# Patient Record
Sex: Male | Born: 1945 | Race: White | Hispanic: No | Marital: Married | Smoking: Former smoker
Health system: Southern US, Community
[De-identification: ages and names within clinical notes are randomized; demographics above are authoritative.]

## PROBLEM LIST (undated history)

## (undated) DIAGNOSIS — E785 Hyperlipidemia, unspecified: Secondary | ICD-10-CM

## (undated) DIAGNOSIS — K219 Gastro-esophageal reflux disease without esophagitis: Secondary | ICD-10-CM

## (undated) DIAGNOSIS — S060X0A Concussion without loss of consciousness, initial encounter: Secondary | ICD-10-CM

## (undated) DIAGNOSIS — I82409 Acute embolism and thrombosis of unspecified deep veins of unspecified lower extremity: Secondary | ICD-10-CM

## (undated) DIAGNOSIS — R55 Syncope and collapse: Secondary | ICD-10-CM

## (undated) DIAGNOSIS — S0003XA Contusion of scalp, initial encounter: Secondary | ICD-10-CM

## (undated) DIAGNOSIS — Z8739 Personal history of other diseases of the musculoskeletal system and connective tissue: Secondary | ICD-10-CM

## (undated) DIAGNOSIS — D369 Benign neoplasm, unspecified site: Secondary | ICD-10-CM

## (undated) DIAGNOSIS — K2270 Barrett's esophagus without dysplasia: Secondary | ICD-10-CM

## (undated) DIAGNOSIS — I2510 Atherosclerotic heart disease of native coronary artery without angina pectoris: Secondary | ICD-10-CM

## (undated) DIAGNOSIS — C801 Malignant (primary) neoplasm, unspecified: Secondary | ICD-10-CM

## (undated) DIAGNOSIS — N529 Male erectile dysfunction, unspecified: Secondary | ICD-10-CM

## (undated) DIAGNOSIS — N400 Enlarged prostate without lower urinary tract symptoms: Secondary | ICD-10-CM

## (undated) DIAGNOSIS — J45909 Unspecified asthma, uncomplicated: Secondary | ICD-10-CM

## (undated) DIAGNOSIS — C6290 Malignant neoplasm of unspecified testis, unspecified whether descended or undescended: Secondary | ICD-10-CM

## (undated) DIAGNOSIS — K449 Diaphragmatic hernia without obstruction or gangrene: Secondary | ICD-10-CM

## (undated) DIAGNOSIS — J189 Pneumonia, unspecified organism: Secondary | ICD-10-CM

## (undated) HISTORY — DX: Male erectile dysfunction, unspecified: N52.9

## (undated) HISTORY — DX: Benign prostatic hyperplasia without lower urinary tract symptoms: N40.0

## (undated) HISTORY — DX: Concussion without loss of consciousness, initial encounter: S06.0X0A

## (undated) HISTORY — DX: Gastro-esophageal reflux disease without esophagitis: K21.9

## (undated) HISTORY — DX: Barrett's esophagus without dysplasia: K22.70

## (undated) HISTORY — PX: TONSILLECTOMY: SUR1361

## (undated) HISTORY — DX: Benign neoplasm, unspecified site: D36.9

## (undated) HISTORY — PX: WISDOM TOOTH EXTRACTION: SHX21

## (undated) HISTORY — PX: CHOLECYSTECTOMY: SHX55

## (undated) HISTORY — PX: CATARACT EXTRACTION, BILATERAL: SHX1313

## (undated) HISTORY — DX: Malignant neoplasm of unspecified testis, unspecified whether descended or undescended: C62.90

## (undated) HISTORY — DX: Malignant (primary) neoplasm, unspecified: C80.1

## (undated) HISTORY — DX: Diaphragmatic hernia without obstruction or gangrene: K44.9

---

## 1995-03-09 DIAGNOSIS — C6290 Malignant neoplasm of unspecified testis, unspecified whether descended or undescended: Secondary | ICD-10-CM

## 1995-03-09 HISTORY — DX: Malignant neoplasm of unspecified testis, unspecified whether descended or undescended: C62.90

## 1995-03-09 HISTORY — PX: TESTICLE REMOVAL: SHX68

## 2007-04-19 ENCOUNTER — Ambulatory Visit: Payer: Self-pay | Admitting: Internal Medicine

## 2007-04-19 DIAGNOSIS — Z8547 Personal history of malignant neoplasm of testis: Secondary | ICD-10-CM | POA: Insufficient documentation

## 2007-04-20 ENCOUNTER — Ambulatory Visit: Payer: Self-pay | Admitting: Internal Medicine

## 2007-04-20 DIAGNOSIS — Z8571 Personal history of Hodgkin lymphoma: Secondary | ICD-10-CM | POA: Insufficient documentation

## 2007-04-23 ENCOUNTER — Encounter: Payer: Self-pay | Admitting: Internal Medicine

## 2007-04-26 LAB — CONVERTED CEMR LAB
AST: 29 units/L (ref 0–37)
Bilirubin, Direct: 0.1 mg/dL (ref 0.0–0.3)
CO2: 30 meq/L (ref 19–32)
Chloride: 107 meq/L (ref 96–112)
Creatinine, Ser: 1.1 mg/dL (ref 0.4–1.5)
Eosinophils Relative: 10.8 % — ABNORMAL HIGH (ref 0.0–5.0)
Glucose, Bld: 105 mg/dL — ABNORMAL HIGH (ref 70–99)
HCT: 41.1 % (ref 39.0–52.0)
Hemoglobin: 14 g/dL (ref 13.0–17.0)
LDL Cholesterol: 122 mg/dL — ABNORMAL HIGH (ref 0–99)
Neutrophils Relative %: 47.1 % (ref 43.0–77.0)
RBC: 4.47 M/uL (ref 4.22–5.81)
RDW: 11.5 % (ref 11.5–14.6)
Sodium: 140 meq/L (ref 135–145)
Total Bilirubin: 0.7 mg/dL (ref 0.3–1.2)
Total CHOL/HDL Ratio: 3.1
Total Protein: 6.2 g/dL (ref 6.0–8.3)
WBC: 4.4 10*3/uL — ABNORMAL LOW (ref 4.5–10.5)
hCG, Beta Chain, Quant, S: 0.93 milliintl units/mL

## 2007-04-27 ENCOUNTER — Encounter: Payer: Self-pay | Admitting: Internal Medicine

## 2007-04-28 ENCOUNTER — Encounter: Payer: Self-pay | Admitting: Internal Medicine

## 2008-03-07 ENCOUNTER — Ambulatory Visit: Payer: Self-pay | Admitting: Internal Medicine

## 2008-03-07 DIAGNOSIS — R12 Heartburn: Secondary | ICD-10-CM | POA: Insufficient documentation

## 2009-01-09 ENCOUNTER — Ambulatory Visit: Payer: Self-pay | Admitting: Diagnostic Radiology

## 2009-01-09 ENCOUNTER — Ambulatory Visit (HOSPITAL_BASED_OUTPATIENT_CLINIC_OR_DEPARTMENT_OTHER): Admission: RE | Admit: 2009-01-09 | Discharge: 2009-01-09 | Payer: Self-pay | Admitting: Internal Medicine

## 2009-01-09 ENCOUNTER — Ambulatory Visit: Payer: Self-pay | Admitting: Internal Medicine

## 2009-01-09 DIAGNOSIS — R059 Cough, unspecified: Secondary | ICD-10-CM | POA: Insufficient documentation

## 2009-01-09 DIAGNOSIS — R05 Cough: Secondary | ICD-10-CM

## 2009-01-28 ENCOUNTER — Telehealth: Payer: Self-pay | Admitting: Internal Medicine

## 2010-04-13 ENCOUNTER — Encounter: Payer: Self-pay | Admitting: Internal Medicine

## 2010-04-29 NOTE — Letter (Signed)
Summary: [facility]  [facility]   Imported By: Maryln Gottron 04/24/2010 10:38:45  _____________________________________________________________________  External Attachment:    Type:   Image     Comment:   External Document

## 2010-05-14 NOTE — Letter (Signed)
Summary: [facility]  [facility]   Imported By: Maryln Gottron 05/06/2010 15:49:03  _____________________________________________________________________  External Attachment:    Type:   Image     Comment:   External Document

## 2011-08-25 DIAGNOSIS — N398 Other specified disorders of urinary system: Secondary | ICD-10-CM | POA: Insufficient documentation

## 2012-09-11 DIAGNOSIS — N400 Enlarged prostate without lower urinary tract symptoms: Secondary | ICD-10-CM | POA: Insufficient documentation

## 2012-09-11 DIAGNOSIS — N529 Male erectile dysfunction, unspecified: Secondary | ICD-10-CM | POA: Insufficient documentation

## 2013-02-23 ENCOUNTER — Ambulatory Visit (INDEPENDENT_AMBULATORY_CARE_PROVIDER_SITE_OTHER): Payer: No Typology Code available for payment source

## 2013-02-23 ENCOUNTER — Ambulatory Visit (INDEPENDENT_AMBULATORY_CARE_PROVIDER_SITE_OTHER): Payer: No Typology Code available for payment source | Admitting: Podiatrist

## 2013-02-23 ENCOUNTER — Encounter: Payer: Self-pay | Admitting: Podiatrist

## 2013-02-23 VITALS — BP 131/84 | HR 72 | Resp 12

## 2013-02-23 DIAGNOSIS — M109 Gout, unspecified: Secondary | ICD-10-CM

## 2013-02-23 DIAGNOSIS — L02619 Cutaneous abscess of unspecified foot: Secondary | ICD-10-CM

## 2013-02-23 DIAGNOSIS — R52 Pain, unspecified: Secondary | ICD-10-CM

## 2013-02-23 MED ORDER — CLINDAMYCIN HCL 300 MG PO CAPS: 300.0000 mg | ORAL_CAPSULE | Freq: Three times a day (TID) | ORAL | Status: DC

## 2013-02-23 MED ORDER — COLCHICINE 0.6 MG PO TABS: 0.6000 mg | ORAL_TABLET | Freq: Every day | ORAL | Status: DC

## 2013-02-23 NOTE — Progress Notes (Signed)
   Subjective:    Patient ID: Dwayne Harper, male    DOB: [birth year], 67 y.o.   MRN: [id number]  HPI Comments: '' THE LT ANKLE IS SWOLLEN AND SORE.''  Foot Pain This is a new problem. The current episode started 1 to 4 weeks ago. The problem occurs constantly. Associated symptoms include myalgias. The symptoms are aggravated by walking and standing. He has tried ice and NSAIDs for the symptoms. The treatment provided mild relief.      Review of Systems  Cardiovascular: Positive for leg swelling.  Musculoskeletal: Positive for myalgias.  Skin: Positive for color change.  All other systems reviewed and are negative.       Objective:   Physical Exam Neurovascular status is intact with palpable pedal pulses and neurological sensation intact. Slight redness along the medial aspect of the left ankle is present. Slight swelling is also noted. X-rays are unremarkable for any sign of fracture or dislocation.       Assessment & Plan:  Gout left vs cellulitus Plan: Injected along the sinus tarsi region with Kenalog and Marcaine mixture. Started him on colcrys Also started him on an antibiotic. I will see him back in one week for followup and will order arthritic panel at that time to discern if gout could be a possibility

## 2013-02-23 NOTE — Patient Instructions (Addendum)
You prescriptions have been called into [facility]-- [address]  Gout Gout is an inflammatory arthritis caused by a buildup of uric acid crystals in the joints. Uric acid is a chemical that is normally present in the blood. When the level of uric acid in the blood is too high it can form crystals that deposit in your joints and tissues. This causes joint redness, soreness, and swelling (inflammation). Repeat attacks are common. Over time, uric acid crystals can form into masses (tophi) near a joint, destroying bone and causing disfigurement. Gout is treatable and often preventable. CAUSES  The disease begins with elevated levels of uric acid in the blood. Uric acid is produced by your body when it breaks down a naturally found substance called purines. Certain foods you eat, such as meats and fish, contain high amounts of purines. Causes of an elevated uric acid level include:  Being passed down from parent to child (heredity).  Diseases that cause increased uric acid production (such as obesity, psoriasis, and certain cancers).  Excessive alcohol use.  Diet, especially diets rich in meat and seafood.  Medicines, including certain cancer-fighting medicines (chemotherapy), water pills (diuretics), and aspirin.  Chronic kidney disease. The kidneys are no longer able to remove uric acid well.  Problems with metabolism. Conditions strongly associated with gout include:  Obesity.  High blood pressure.  High cholesterol.  Diabetes. Not everyone with elevated uric acid levels gets gout. It is not understood why some people get gout and others do not. Surgery, joint injury, and eating too much of certain foods are some of the factors that can lead to gout attacks. SYMPTOMS   An attack of gout comes on quickly. It causes intense pain with redness, swelling, and warmth in a joint.  Fever can occur.  Often, only one joint is involved. Certain joints are more commonly  involved:  Base of the big toe.  Knee.  Ankle.  Wrist.  Finger. Without treatment, an attack usually goes away in a few days to weeks. Between attacks, you usually will not have symptoms, which is different from many other forms of arthritis. DIAGNOSIS  Your caregiver will suspect gout based on your symptoms and exam. In some cases, tests may be recommended. The tests may include:  Blood tests.  Urine tests.  X-rays.  Joint fluid exam. This exam requires a needle to remove fluid from the joint (arthrocentesis). Using a microscope, gout is confirmed when uric acid crystals are seen in the joint fluid. TREATMENT  There are two phases to gout treatment: treating the sudden onset (acute) attack and preventing attacks (prophylaxis).  Treatment of an Acute Attack.  Medicines are used. These include anti-inflammatory medicines or steroid medicines.  An injection of steroid medicine into the affected joint is sometimes necessary.  The painful joint is rested. Movement can worsen the arthritis.  You may use warm or cold treatments on painful joints, depending which works best for you.  Treatment to Prevent Attacks.  If you suffer from frequent gout attacks, your caregiver may advise preventive medicine. These medicines are started after the acute attack subsides. These medicines either help your kidneys eliminate uric acid from your body or decrease your uric acid production. You may need to stay on these medicines for a very long time.  The early phase of treatment with preventive medicine can be associated with an increase in acute gout attacks. For this reason, during the first few months of treatment, your caregiver may also  advise you to take medicines usually used for acute gout treatment. Be sure you understand your caregiver's directions. Your caregiver may make several adjustments to your medicine dose before these medicines are effective.  Discuss dietary treatment with your  caregiver or dietitian. Alcohol and drinks high in sugar and fructose and foods such as meat, poultry, and seafood can increase uric acid levels. Your caregiver or dietician can advise you on drinks and foods that should be limited. HOME CARE INSTRUCTIONS   Do not take aspirin to relieve pain. This raises uric acid levels.  Only take over-the-counter or prescription medicines for pain, discomfort, or fever as directed by your caregiver.  Rest the joint as much as possible. When in bed, keep sheets and blankets off painful areas.  Keep the affected joint raised (elevated).  Apply warm or cold treatments to painful joints. Use of warm or cold treatments depends on which works best for you.  Use crutches if the painful joint is in your leg.  Drink enough fluids to keep your urine clear or pale yellow. This helps your body get rid of uric acid. Limit alcohol, sugary drinks, and fructose drinks.  Follow your dietary instructions. Pay careful attention to the amount of protein you eat. Your daily diet should emphasize fruits, vegetables, whole grains, and fat-free or low-fat milk products. Discuss the use of coffee, vitamin C, and cherries with your caregiver or dietician. These may be helpful in lowering uric acid levels.  Maintain a healthy body weight. SEEK MEDICAL CARE IF:   You develop diarrhea, vomiting, or any side effects from medicines.  You do not feel better in 24 hours, or you are getting worse. SEEK IMMEDIATE MEDICAL CARE IF:   Your joint becomes suddenly more tender, and you have chills or a fever. MAKE SURE YOU:   Understand these instructions.  Will watch your condition.  Will get help right away if you are not doing well or get worse. Document Released: 02/20/2000 Document Revised: 06/19/2012 Document Reviewed: 10/06/2011 [facility] Patient Information 2014 [place], [place].   Cellulitis Cellulitis is an infection of the skin and the tissue beneath it. The infected  area is usually red and tender. Cellulitis occurs most often in the arms and lower legs.  CAUSES  Cellulitis is caused by bacteria that enter the skin through cracks or cuts in the skin. The most common types of bacteria that cause cellulitis are Staphylococcus and Streptococcus. SYMPTOMS   Redness and warmth.  Swelling.  Tenderness or pain.  Fever. DIAGNOSIS  Your caregiver can usually determine what is wrong based on a physical exam. Blood tests may also be done. TREATMENT  Treatment usually involves taking an antibiotic medicine. HOME CARE INSTRUCTIONS   Take your antibiotics as directed. Finish them even if you start to feel better.  Keep the infected arm or leg elevated to reduce swelling.  Apply a warm cloth to the affected area up to 4 times per day to relieve pain.  Only take over-the-counter or prescription medicines for pain, discomfort, or fever as directed by your caregiver.  Keep all follow-up appointments as directed by your caregiver. SEEK MEDICAL CARE IF:   You notice red streaks coming from the infected area.  Your red area gets larger or turns dark in color.  Your bone or joint underneath the infected area becomes painful after the skin has healed.  Your infection returns in the same area or another area.  You notice a swollen bump in the infected area.  You develop new symptoms. SEEK IMMEDIATE MEDICAL CARE IF:   You have a fever.  You feel very sleepy.  You develop vomiting or diarrhea.  You have a general ill feeling (malaise) with muscle aches and pains. MAKE SURE YOU:   Understand these instructions.  Will watch your condition.  Will get help right away if you are not doing well or get worse. Document Released: 12/02/2004 Document Revised: 08/24/2011 Document Reviewed: 05/10/2011 [facility] Patient Information 2014 [place], [place].

## 2013-02-28 ENCOUNTER — Ambulatory Visit: Payer: Self-pay | Admitting: Podiatry

## 2013-03-16 ENCOUNTER — Ambulatory Visit: Payer: No Typology Code available for payment source | Admitting: Podiatrist

## 2013-10-06 HISTORY — PX: COLONOSCOPY: SHX174

## 2015-09-06 DIAGNOSIS — I82409 Acute embolism and thrombosis of unspecified deep veins of unspecified lower extremity: Secondary | ICD-10-CM

## 2015-09-06 HISTORY — DX: Acute embolism and thrombosis of unspecified deep veins of unspecified lower extremity: I82.409

## 2015-09-26 ENCOUNTER — Observation Stay (HOSPITAL_COMMUNITY)
Admission: EM | Admit: 2015-09-26 | Discharge: 2015-09-29 | Disposition: A | Discharge status: Home / Self Care | Payer: PRIVATE HEALTH INSURANCE | Attending: Internal Medicine | Admitting: Internal Medicine

## 2015-09-26 ENCOUNTER — Encounter (HOSPITAL_COMMUNITY): Payer: Self-pay | Admitting: *Deleted

## 2015-09-26 ENCOUNTER — Emergency Department (HOSPITAL_COMMUNITY): Payer: PRIVATE HEALTH INSURANCE

## 2015-09-26 ENCOUNTER — Observation Stay (HOSPITAL_COMMUNITY): Payer: PRIVATE HEALTH INSURANCE

## 2015-09-26 DIAGNOSIS — G9340 Encephalopathy, unspecified: Secondary | ICD-10-CM | POA: Diagnosis present

## 2015-09-26 DIAGNOSIS — D696 Thrombocytopenia, unspecified: Secondary | ICD-10-CM | POA: Diagnosis not present

## 2015-09-26 DIAGNOSIS — W19XXXA Unspecified fall, initial encounter: Secondary | ICD-10-CM | POA: Diagnosis not present

## 2015-09-26 DIAGNOSIS — S0003XA Contusion of scalp, initial encounter: Secondary | ICD-10-CM | POA: Diagnosis present

## 2015-09-26 DIAGNOSIS — Z8249 Family history of ischemic heart disease and other diseases of the circulatory system: Secondary | ICD-10-CM | POA: Diagnosis not present

## 2015-09-26 DIAGNOSIS — R413 Other amnesia: Secondary | ICD-10-CM | POA: Insufficient documentation

## 2015-09-26 DIAGNOSIS — S060X0A Concussion without loss of consciousness, initial encounter: Secondary | ICD-10-CM | POA: Insufficient documentation

## 2015-09-26 DIAGNOSIS — Z7982 Long term (current) use of aspirin: Secondary | ICD-10-CM | POA: Insufficient documentation

## 2015-09-26 DIAGNOSIS — Z8571 Personal history of Hodgkin lymphoma: Secondary | ICD-10-CM | POA: Diagnosis not present

## 2015-09-26 DIAGNOSIS — R55 Syncope and collapse: Secondary | ICD-10-CM | POA: Diagnosis not present

## 2015-09-26 DIAGNOSIS — Z8547 Personal history of malignant neoplasm of testis: Secondary | ICD-10-CM | POA: Insufficient documentation

## 2015-09-26 HISTORY — DX: Contusion of scalp, initial encounter: S00.03XA

## 2015-09-26 HISTORY — DX: Syncope and collapse: R55

## 2015-09-26 HISTORY — DX: Thrombocytopenia, unspecified: D69.6

## 2015-09-26 LAB — CBC WITH DIFFERENTIAL/PLATELET
BASOS ABS: 0 10*3/uL (ref 0.0–0.1)
BASOS PCT: 0 %
Eosinophils Absolute: 0.2 10*3/uL (ref 0.0–0.7)
Eosinophils Relative: 2 %
HEMATOCRIT: 45.5 % (ref 39.0–52.0)
HEMOGLOBIN: 15.6 g/dL (ref 13.0–17.0)
Lymphocytes Relative: 15 %
Lymphs Abs: 1.5 10*3/uL (ref 0.7–4.0)
MCH: 31.2 pg (ref 26.0–34.0)
MCHC: 34.3 g/dL (ref 30.0–36.0)
MCV: 91 fL (ref 78.0–100.0)
Monocytes Absolute: 0.8 10*3/uL (ref 0.1–1.0)
Monocytes Relative: 8 %
NEUTROS ABS: 7.4 10*3/uL (ref 1.7–7.7)
NEUTROS PCT: 75 %
Platelets: 131 10*3/uL — ABNORMAL LOW (ref 150–400)
RBC: 5 MIL/uL (ref 4.22–5.81)
RDW: 12.2 % (ref 11.5–15.5)
WBC: 9.7 10*3/uL (ref 4.0–10.5)

## 2015-09-26 LAB — URINALYSIS W MICROSCOPIC (NOT AT ARMC)
BACTERIA UA: NONE SEEN
BILIRUBIN URINE: NEGATIVE
Glucose, UA: NEGATIVE mg/dL
HGB URINE DIPSTICK: NEGATIVE
KETONES UR: 15 mg/dL — AB
Leukocytes, UA: NEGATIVE
NITRITE: NEGATIVE
PROTEIN: NEGATIVE mg/dL
RBC / HPF: NONE SEEN RBC/hpf (ref 0–5)
Specific Gravity, Urine: 1.017 (ref 1.005–1.030)
pH: 6.5 (ref 5.0–8.0)

## 2015-09-26 LAB — COMPREHENSIVE METABOLIC PANEL
ALK PHOS: 50 U/L (ref 38–126)
ALT: 27 U/L (ref 17–63)
ANION GAP: 7 (ref 5–15)
AST: 32 U/L (ref 15–41)
Albumin: 3.9 g/dL (ref 3.5–5.0)
BILIRUBIN TOTAL: 0.6 mg/dL (ref 0.3–1.2)
BUN: 18 mg/dL (ref 6–20)
CALCIUM: 9.2 mg/dL (ref 8.9–10.3)
CO2: 26 mmol/L (ref 22–32)
Chloride: 105 mmol/L (ref 101–111)
Creatinine, Ser: 0.95 mg/dL (ref 0.61–1.24)
Glucose, Bld: 111 mg/dL — ABNORMAL HIGH (ref 65–99)
Potassium: 4.1 mmol/L (ref 3.5–5.1)
SODIUM: 138 mmol/L (ref 135–145)
TOTAL PROTEIN: 7.1 g/dL (ref 6.5–8.1)

## 2015-09-26 LAB — RAPID URINE DRUG SCREEN, HOSP PERFORMED
Amphetamines: NOT DETECTED
Barbiturates: NOT DETECTED
Benzodiazepines: NOT DETECTED
Cocaine: NOT DETECTED
OPIATES: NOT DETECTED
Tetrahydrocannabinol: NOT DETECTED

## 2015-09-26 MED ORDER — PANTOPRAZOLE SODIUM 40 MG PO TBEC
40.0000 mg | DELAYED_RELEASE_TABLET | Freq: Every day | ORAL | Status: DC
  Administered 2015-09-26 – 2015-09-29 (×4): 40 mg via ORAL
  Filled 2015-09-26 (×4): qty 1

## 2015-09-26 MED ORDER — ONDANSETRON HCL 4 MG PO TABS: 4.0000 mg | ORAL_TABLET | Freq: Four times a day (QID) | ORAL | Status: DC | PRN

## 2015-09-26 MED ORDER — LIDOCAINE-EPINEPHRINE (PF) 2 %-1:200000 IJ SOLN
20.0000 mL | Freq: Once | INTRAMUSCULAR | Status: AC
  Administered 2015-09-26: 20 mL
  Filled 2015-09-26: qty 20

## 2015-09-26 MED ORDER — GLUCOSAMINE-CHONDROITIN 500-400 MG PO TABS: 1.0000 | ORAL_TABLET | Freq: Every day | ORAL | Status: DC

## 2015-09-26 MED ORDER — SODIUM CHLORIDE 0.9% FLUSH
3.0000 mL | Freq: Two times a day (BID) | INTRAVENOUS | Status: DC
  Administered 2015-09-26 – 2015-09-29 (×5): 3 mL via INTRAVENOUS

## 2015-09-26 MED ORDER — BISACODYL 10 MG RE SUPP: 10.0000 mg | Freq: Every day | RECTAL | Status: DC | PRN

## 2015-09-26 MED ORDER — ALUM & MAG HYDROXIDE-SIMETH 200-200-20 MG/5ML PO SUSP
30.0000 mL | Freq: Four times a day (QID) | ORAL | Status: DC | PRN
  Administered 2015-09-26 – 2015-09-28 (×3): 30 mL via ORAL
  Filled 2015-09-26 (×3): qty 30

## 2015-09-26 MED ORDER — CO Q 10 10 MG PO CAPS: ORAL_CAPSULE | Freq: Every day | ORAL | Status: DC

## 2015-09-26 MED ORDER — ASPIRIN EC 81 MG PO TBEC
81.0000 mg | DELAYED_RELEASE_TABLET | Freq: Every day | ORAL | Status: DC
  Administered 2015-09-26 – 2015-09-28 (×3): 81 mg via ORAL
  Filled 2015-09-26 (×3): qty 1

## 2015-09-26 MED ORDER — ACETAMINOPHEN 650 MG RE SUPP: 650.0000 mg | Freq: Four times a day (QID) | RECTAL | Status: DC | PRN

## 2015-09-26 MED ORDER — TETANUS-DIPHTH-ACELL PERTUSSIS 5-2.5-18.5 LF-MCG/0.5 IM SUSP
0.5000 mL | Freq: Once | INTRAMUSCULAR | Status: AC
  Administered 2015-09-26: 0.5 mL via INTRAMUSCULAR
  Filled 2015-09-26: qty 0.5

## 2015-09-26 MED ORDER — ONDANSETRON HCL 4 MG/2ML IJ SOLN: 4.0000 mg | Freq: Four times a day (QID) | INTRAMUSCULAR | Status: DC | PRN

## 2015-09-26 MED ORDER — HYDROCODONE-ACETAMINOPHEN 5-325 MG PO TABS: 1.0000 | ORAL_TABLET | ORAL | Status: DC | PRN

## 2015-09-26 MED ORDER — ACETAMINOPHEN 325 MG PO TABS
650.0000 mg | ORAL_TABLET | Freq: Four times a day (QID) | ORAL | Status: DC | PRN
  Administered 2015-09-26 – 2015-09-28 (×4): 650 mg via ORAL
  Filled 2015-09-26 (×4): qty 2

## 2015-09-26 MED ORDER — SODIUM CHLORIDE 0.9 % IV SOLN
INTRAVENOUS | Status: AC
  Administered 2015-09-26: 1000 mL via INTRAVENOUS

## 2015-09-26 NOTE — ED Notes (Signed)
Head wound cleansed with H2O2 and NS.  The largest wound appears to be longer than originally charted; it is approx 5-6 cm.

## 2015-09-26 NOTE — ED Notes (Signed)
MD at bedside, Hospitalist.

## 2015-09-26 NOTE — Progress Notes (Signed)
New pt admission from ED. Pt brought to the floor in stable condition. Vitals taken. Initial Assessment done. All immediate pertinent needs to patient addressed. Patient Guide given to patient. Important safety instructions relating to hospitalization reviewed with patient. Patient verbalized understanding. Pt has head lacerations to L side of head with staples present. Will continue to monitor pt.  Maurene Capes RN

## 2015-09-26 NOTE — ED Notes (Signed)
Patient transported to CT 

## 2015-09-26 NOTE — ED Notes (Signed)
ECHOCARDIOGRAM ACCIDENTALLY CLICKED AS COMPLETE. PT STILL NEEDS AN ECHOCARDIOGRAM.

## 2015-09-26 NOTE — ED Notes (Addendum)
Three lacerations noted to posterior left scalp, bleeding controlled with hematoma present.  One approx 1 cm, second is approx 2cm, third laceration is approx 3 cm.  Hair cleansed of dried blood as well as possible.  Continues to have memory issues.

## 2015-09-26 NOTE — ED Provider Notes (Signed)
CSN: [id number]     Arrival date & time 09/26/15  1053 History   First MD Initiated Contact with Patient 09/26/15 1116     Chief Complaint  Patient presents with  . Fall  . Head Injury     (Consider location/radiation/quality/duration/timing/severity/associated sxs/prior Treatment) HPI   Patient is a 70 year old male with a history of testicular cancer who presents to the ED after a fall that occurred roughly 30 minutes PTA. Patient was packing the car to go on vacation, he was in his garage. He is unsure if it was a syncopal episode or if he slipped. He called for his wife. She found him on the garage floor, she states he was confused. They're unsure if he lost consciousness. Wife states that in route to the ED he could not remember where they were going, he thought it was Saturday, and he was unsure of their anniversary date. Patient currently is complaining of lightheadedness and a foggy feeling. He has no other complaints. Wife states he has increased stress at his job and he has had little sleep in the past week. Patient denies headache, visual changes, nausea, vomiting, abdominal pain, weakness, numbness, chest pain, shortness of breath, back pain.  Of note: The patient is wife returned from a trip to [place] roughly 2 weeks ago. Patient denies calf pain or swelling or shortness of breath.  Past Medical History  Diagnosis Date  . Cancer ([place])   . Syncope   . Gout   . Scalp hematoma    History reviewed. No pertinent past surgical history. Family History  Problem Relation Age of Onset  . CAD Mother   . Heart attack Mother   . Coronary artery disease Father   . Aneurysm Father    Social History  Substance Use Topics  . Smoking status: Never Smoker   . Smokeless tobacco: None  . Alcohol Use: No    Review of Systems  Constitutional: Negative for fever and chills.  HENT: Negative for trouble swallowing and voice change.   Eyes: Negative for photophobia and visual disturbance.   Respiratory: Negative for cough and shortness of breath.   Cardiovascular: Negative for chest pain and leg swelling.  Gastrointestinal: Negative for nausea, vomiting, abdominal pain, diarrhea and constipation.  Musculoskeletal: Negative for back pain, neck pain and neck stiffness.  Skin: Positive for wound.  Neurological: Positive for light-headedness. Negative for dizziness, facial asymmetry, speech difficulty, weakness, numbness and headaches.  Psychiatric/Behavioral: Positive for confusion. Negative for agitation.      Allergies  Lactose intolerance (gi); Peanut-containing drug products; and Penicillins  Home Medications   Prior to Admission medications   Medication Sig Start Date End Date Taking? Authorizing Provider  Ascorbic Acid (VITAMIN C PO) Take 1 tablet by mouth daily.   Yes Historical Provider, MD  Coenzyme Q10 (CO Q 10 PO) Take 1 tablet by mouth daily.   Yes Historical Provider, MD  Cyanocobalamin (VITAMIN B-12 PO) Take 1 tablet by mouth daily.   Yes Historical Provider, MD  glucosamine-chondroitin 500-400 MG tablet Take 1 tablet by mouth daily.    Yes Historical Provider, MD  Multiple Vitamin (MULTIVITAMIN) tablet Take 1 tablet by mouth daily.   Yes Historical Provider, MD  Omega-3 Fatty Acids (FISH OIL) 1000 MG CAPS Take 1 capsule by mouth daily.    Yes Historical Provider, MD  omeprazole (PRILOSEC) 20 MG capsule Take 20 mg by mouth daily.   Yes Historical Provider, MD  aspirin 81 MG tablet Take 81 mg by  mouth daily.    Historical Provider, MD  clindamycin (CLEOCIN) 300 MG capsule Take 1 capsule (300 mg total) by mouth 3 (three) times daily. Patient not taking: Reported on 09/26/2015 02/23/13   Bronson Ing, DPM  colchicine 0.6 MG tablet Take 1 tablet (0.6 mg total) by mouth daily. Patient not taking: Reported on 09/26/2015 02/23/13   Bronson Ing, DPM   BP 123/73 mmHg  Pulse 69  Temp(Src) 98 F (36.7 C) (Oral)  Resp 14  SpO2 99% Physical Exam    Physical Exam  Constitutional: Pt is oriented to person, place, and time. Pt appears well-developed and well-nourished. No distress.  HENT:  Head: Normocephalic multiple lacerations noted to back of head, one measuring roughly 5cm, one roughly 1.5 and the other roughly 2cm  Mouth/Throat: Oropharynx is clear and moist.  Eyes: Conjunctivae and EOM are normal. Pupils are equal, round, and reactive to light. No scleral icterus.  No horizontal, vertical or rotational nystagmus  Neck: Normal range of motion. Neck supple.  Full active and passive ROM without pain No midline or paraspinal tenderness No nuchal rigidity or meningeal signs  Cardiovascular: Normal rate, regular rhythm no murmur rubs or gallops, 2+ DP and PT pulses.   Pulmonary/Chest: Effort normal and breath sounds normal. No respiratory distress. Pt has no wheezes. No rales.  Abdominal: Soft. Bowel sounds are normal. There is no tenderness. There is no rebound and no guarding, no CVA tenderness.  Musculoskeletal: Normal range of motion, no edema noted.  Neurological: Pt. is alert and oriented to person, place, and time. No cranial nerve deficit.  Exhibits normal muscle tone. Coordination normal.  Mental Status:  Alert, oriented, thought content appropriate. Speech fluent without evidence of aphasia. Able to follow 2 step commands without difficulty.  Cranial Nerves:  II:  Peripheral visual fields grossly normal, pupils equal, round, reactive to light III,IV, VI: ptosis not present, extra-ocular motions intact bilaterally  V,VII: smile symmetric, facial light touch sensation equal VIII: hearing grossly normal bilaterally  IX,X: midline uvula rise  XI: bilateral shoulder shrug equal and strong XII: midline tongue extension  Motor:  5/5 in upper and lower extremities bilaterally including strong and equal grip strength and dorsiflexion/plantar flexion Sensory: Light touch normal in all extremities.  Cerebellar: normal  finger-to-nose with bilateral upper extremities, no pronator drift  Gait: normal gait  CV: distal pulses palpable throughout   Skin: Skin is warm and dry. No rash noted. Pt is not diaphoretic.  Psychiatric: Pt has a normal mood and affect. Behavior is normal. Judgment and thought content normal.  Nursing note and vitals reviewed.    ED Course  .Marland KitchenLaceration Repair Date/Time: 09/26/2015 3:35 PM Performed by: Claris Gower,  L Authorized by: Jackson Latino L Consent: Verbal consent obtained. Risks and benefits: risks, benefits and alternatives were discussed Consent given by: patient Patient understanding: patient states understanding of the procedure being performed Required items: required blood products, implants, devices, and special equipment available Patient identity confirmed: arm band Time out: Immediately prior to procedure a "time out" was called to verify the correct patient, procedure, equipment, support staff and site/side marked as required. Body area: head/neck Location details: scalp Laceration length: 7 cm Foreign bodies: no foreign bodies Anesthesia: local infiltration Local anesthetic: lidocaine 2% with epinephrine Anesthetic total: 9 ml Patient sedated: no Preparation: Patient was prepped and draped in the usual sterile fashion. Irrigation solution: saline Irrigation method: syringe Amount of cleaning: standard Debridement: none Skin closure: staples Number of sutures: 14 Approximation: close  Approximation difficulty: simple Dressing: 4x4 sterile gauze Patient tolerance: Patient tolerated the procedure well with no immediate complications   (including critical care time) Labs Review Labs Reviewed  COMPREHENSIVE METABOLIC PANEL - Abnormal; Notable for the following:    Glucose, Bld 111 (*)    All other components within normal limits  CBC WITH DIFFERENTIAL/PLATELET - Abnormal; Notable for the following:    Platelets 131 (*)    All other components  within normal limits  URINALYSIS W MICROSCOPIC (NOT AT [facility])  URINE RAPID DRUG SCREEN, HOSP PERFORMED    Imaging Review Ct Head Wo Contrast  09/26/2015  CLINICAL DATA:  Pain following fall.  Transient memory loss of event EXAM: CT HEAD WITHOUT CONTRAST TECHNIQUE: Contiguous axial images were obtained from the base of the skull through the vertex without intravenous contrast. COMPARISON:  None. FINDINGS: Brain: There is age related volume loss. There is no intracranial mass, hemorrhage, extra-axial fluid collection, or midline shift. The gray-white compartments appear normal. No acute infarct evident. Vascular: There are foci of vascular calcification in left carotid siphon and cavernous carotid artery regions on the left. There are no hyperdense vessels. Skull: The bony calvarium appears intact. There is a left parietal scalp hematoma. Sinuses/Orbits: Visualized orbits appear symmetric bilaterally. Visualized paranasal sinuses are clear. Other: Visualized mastoid air cells are clear. IMPRESSION: Age related volume loss. No intracranial mass, hemorrhage, or extra-axial fluid collection. Gray-white compartments appear normal. Mild calcification in left carotid siphon and cavernous carotid artery regions. There is a left parietal scalp hematoma. Electronically Signed   By: Lowella Grip III M.D.   On: 09/26/2015 13:22   I have personally reviewed and evaluated these images and lab results as part of my medical decision-making.   EKG Interpretation   Date/Time:  Friday September 26 2015 11:25:54 EDT Ventricular Rate:  70 PR Interval:    QRS Duration: 84 QT Interval:  384 QTC Calculation: 415 R Axis:   51 Text Interpretation:  Normal sinus rhythm Normal ECG No old tracing to  compare Confirmed by GOLDSTON MD, SCOTT [phone]) on 09/26/2015 11:28:44 AM      MDM   Final diagnoses:  Scalp hematoma, initial encounter   Patient with syncopal episode. Unsure of etiology. CT scan revealed no acute  abnormalities. EKG with normal sinus rhythm. Labs unremarkable except for thrombocytopenia. Patient's confusion has improved since arrival to the ED. He is aware of this confusion. We feel at this time it is best to admit the patient for observation for further workup. I will consult the hospitalist team for admission.  Laceration repaired in the ED with staples. Patient given fluids while in the ED.  I spoke with Dyanne Carrel, NP who will admit the patient for further evaluation and treatment. Thank you to Dyanne Carrel and Dr. Eulas Post for your care of the patient.  Patient case discussed and patient seen by Dr. Regenia Skeeter who agrees with the above plan.   Kalman Drape, PA 09/26/15 [place], MD 10/02/15 [phone]

## 2015-09-26 NOTE — ED Notes (Signed)
Returned from CT--No change in status. 

## 2015-09-26 NOTE — ED Notes (Addendum)
Pt and family reports that pt fell and hit back of head on cement floor. Unknown loc. Denies being on blood thinners. Has laceration to back of head.

## 2015-09-26 NOTE — H&P (Signed)
History and Physical    Dwayne Harper [id number] DOB: 10-10-1945 DOA: 09/26/2015  PCP: Drema Pry, DO Dwayne Harper Patient coming from: home  Chief Complaint: syncope and collapse  HPI: Dwayne Harper is a very pleasant 70 y.o. male with medical history significant for gout remote testicular cancer since to the emergency department with the chief complaint of fall/collapse and subsequent memory loss. Initial evaluation concerning for concussion related to head injury secondary to likely syncopal event.  Information is obtained from the patient and the wife is at the bedside. Of note information from patient may be unreliable as he is demonstrating short-term memory deficit. He reports last memory packing the car in the garage in preparation for trip to the beach. His next memory is sitting on the floor of the garage with his wife telling him he needed go to the hospital. She reports she heard him calling her from the garage and she found him on the floor lying in a "pool of blood" and he was somewhat confused. It is unclear if he actually lost consciousness. Wife states she helped him up he had a steady gait as he ambulated to the car but during the ride he was disoriented to time had forgotten where they were going and was unsure of their anniversary date. He denies any chest pain palpitations headache dizziness visual disturbances. He denies any numbness or tingling of extremities. He denies shortness of breath cough fever chills lower extremity edema. He denies abdominal pain nausea vomiting diarrhea. He denies dysuria hematuria frequency or urgency. He states he's been eating and drinking his normal amount and denies any unintentional weight loss. Wife reports increased stress at his job and decreased sleep. In addition they have been home for 3 weeks several weeks prior to that they were traveling extensively in [place].    ED Course: In the emergency department he is afebrile hemodynamically  stable and not hypoxic.  Review of Systems: As per HPI otherwise 10 point review of systems negative.   Ambulatory Status: He ambulates independently with a steady gait no recent falls  Past Medical History  Diagnosis Date  . Cancer ([place])   . Syncope   . Gout   . Scalp hematoma     History reviewed. No pertinent past surgical history.  Social History  Lives at home with his wife. 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 Marital Status: Married    Spouse Name: N/A  . Number of Children: N/A  . Years of Education: N/A   Occupational History  . Not on file.   Social History Main Topics  . Smoking status: Never Smoker   . Smokeless tobacco: Not on file  . Alcohol Use: No  . Drug Use: No  . Sexual Activity: Not on file   Other Topics Concern  . Not on file   Social History Narrative    Allergies  Allergen Reactions  . Lactose Intolerance (Gi)   . Peanut-Containing Drug Products Nausea And Vomiting  . Penicillins     Childhood ; Mother has a severe reaction     Family History  Problem Relation Age of Onset  . CAD Mother   . Heart attack Mother   . Coronary artery disease Father   . Aneurysm Father     Prior to Admission medications   Medication Sig Start Date End Date Taking? Authorizing Provider  Ascorbic Acid (VITAMIN C PO) Take 1 tablet by mouth  daily.   Yes Historical Provider, MD  Coenzyme Q10 (CO Q 10 PO) Take 1 tablet by mouth daily.   Yes Historical Provider, MD  Cyanocobalamin (VITAMIN B-12 PO) Take 1 tablet by mouth daily.   Yes Historical Provider, MD  glucosamine-chondroitin 500-400 MG tablet Take 1 tablet by mouth daily.    Yes Historical Provider, MD  Multiple Vitamin (MULTIVITAMIN) tablet Take 1 tablet by mouth daily.   Yes Historical Provider, MD  Omega-3 Fatty Acids (FISH OIL) 1000 MG CAPS Take 1 capsule by mouth daily.    Yes Historical Provider, MD  omeprazole (PRILOSEC) 20 MG capsule Take 20 mg by  mouth daily.   Yes Historical Provider, MD  aspirin 81 MG tablet Take 81 mg by mouth daily.    Historical Provider, MD  clindamycin (CLEOCIN) 300 MG capsule Take 1 capsule (300 mg total) by mouth 3 (three) times daily. Patient not taking: Reported on 09/26/2015 02/23/13   Bronson Ing, DPM  colchicine 0.6 MG tablet Take 1 tablet (0.6 mg total) by mouth daily. Patient not taking: Reported on 09/26/2015 02/23/13   Bronson Ing, DPM    Physical Exam: Filed Vitals:   09/26/15 1215 09/26/15 1230 09/26/15 1245 09/26/15 1330  BP: 123/83 138/88 118/87 130/78  Pulse: 77 72 75 71  Temp:      TempSrc:      Resp: 12 20 14 13   SpO2: 97% 98% 99% 98%     General:  Appears Slightly anxious but not uncomfortable Eyes:  PERRL, EOMI, normal lids, iris ENT:  grossly normal hearing, lips & tongue, mucous membranes of his mouth are pink slightly dry Neck:  no LAD, masses or thyromegaly Cardiovascular:  RRR, no m/r/g. No LE edema. Pedal pulses present and palpable Respiratory:  CTA bilaterally, no w/r/r. Normal respiratory effort. Abdomen:  soft, ntnd, positive bowel sounds throughout no guarding or rebounding Skin:  no rash or induration seen on limited exam, respiration left Musculoskeletal:  grossly normal tone BUE/BLE, good ROM, no bony abnormality Psychiatric:  grossly normal mood and affect, speech fluent and appropriate, AOx3 Neurologic:  CN 2-12 grossly intact, moves all extremities in coordinated fashion, sensation intact. Short-term memory deficit speech clear facial symmetry follows commands oriented 3  Labs on Admission: I have personally reviewed following labs and imaging studies  CBC:  Recent Labs Lab 09/26/15 1230  WBC 9.7  NEUTROABS 7.4  HGB 15.6  HCT 45.5  MCV 91.0  PLT [id number]*   Basic Metabolic Panel:  Recent Labs Lab 09/26/15 1230  NA 138  K 4.1  CL 105  CO2 26  GLUCOSE 111*  BUN 18  CREATININE 0.95  CALCIUM 9.2   GFR: CrCl cannot be calculated (Unknown  ideal weight.). Liver Function Tests:  Recent Labs Lab 09/26/15 1230  AST 32  ALT 27  ALKPHOS 50  BILITOT 0.6  PROT 7.1  ALBUMIN 3.9   No results for input(s): LIPASE, AMYLASE in the last 168 hours. No results for input(s): AMMONIA in the last 168 hours. Coagulation Profile: No results for input(s): INR, PROTIME in the last 168 hours. Cardiac Enzymes: No results for input(s): CKTOTAL, CKMB, CKMBINDEX, TROPONINI in the last 168 hours. BNP (last 3 results) No results for input(s): PROBNP in the last 8760 hours. HbA1C: No results for input(s): HGBA1C in the last 72 hours. CBG: No results for input(s): GLUCAP in the last 168 hours. Lipid Profile: No results for input(s): CHOL, HDL, LDLCALC, TRIG, CHOLHDL, LDLDIRECT in the last 72 hours.  Thyroid Function Tests: No results for input(s): TSH, T4TOTAL, FREET4, T3FREE, THYROIDAB in the last 72 hours. Anemia Panel: No results for input(s): VITAMINB12, FOLATE, FERRITIN, TIBC, IRON, RETICCTPCT in the last 72 hours. Urine analysis: No results found for: COLORURINE, APPEARANCEUR, LABSPEC, PHURINE, GLUCOSEU, HGBUR, BILIRUBINUR, KETONESUR, PROTEINUR, UROBILINOGEN, NITRITE, LEUKOCYTESUR  Creatinine Clearance: CrCl cannot be calculated (Unknown ideal weight.).  Sepsis Labs: @LABRCNTIP (procalcitonin:4,lacticidven:4) )No results found for this or any previous visit (from the past 240 hour(s)).   Radiological Exams on Admission: Ct Head Wo Contrast  09/26/2015  CLINICAL DATA:  Pain following fall.  Transient memory loss of event EXAM: CT HEAD WITHOUT CONTRAST TECHNIQUE: Contiguous axial images were obtained from the base of the skull through the vertex without intravenous contrast. COMPARISON:  None. FINDINGS: Brain: There is age related volume loss. There is no intracranial mass, hemorrhage, extra-axial fluid collection, or midline shift. The gray-white compartments appear normal. No acute infarct evident. Vascular: There are foci of vascular  calcification in left carotid siphon and cavernous carotid artery regions on the left. There are no hyperdense vessels. Skull: The bony calvarium appears intact. There is a left parietal scalp hematoma. Sinuses/Orbits: Visualized orbits appear symmetric bilaterally. Visualized paranasal sinuses are clear. Other: Visualized mastoid air cells are clear. IMPRESSION: Age related volume loss. No intracranial mass, hemorrhage, or extra-axial fluid collection. Gray-white compartments appear normal. Mild calcification in left carotid siphon and cavernous carotid artery regions. There is a left parietal scalp hematoma. Electronically Signed   By: Lowella Grip III M.D.   On: 09/26/2015 13:22    EKG: Independently reviewed. Normal sinus rhythm Normal ECG No old tracing to compare  Assessment/Plan Principal Problem:   Syncope and collapse Active Problems:   History of Hodgkin's disease   Scalp hematoma   Acute encephalopathy   Thrombocytopenia (HCC)   #1. Syncope and collapse. Etiology unclear. CT of the head age-related volume loss no intracranial mass hemorrhage, Initial troponin negative. EKG without acute abnormality. No metabolic derangement. No leukocytosis patient's afebrile and hemodynamically stable and nontoxic appearing. No medication changes -Admit to telemetry -cycle troponin -Serial EKG -2-D echo -EEG -Urinalysis -Urine drug screen -Chest x-ray -orthostatic vital signs  2. Acute encephalopathy likely related to head wound related to #1. CT of the head as noted above. Patient demonstrated mild confusion with memory deficits. Improving on admission. MD spoke to neuro who recommended EEG. No focal defecits -will hold off on MRI as improving on admission -neuro checks -obtain folate B12.rpr  #3.scalp hematoma -sutured by ed staff -monitor  4. Thrombocytopenia. Mild. -Outpatient follow-up     DVT prophylaxis: scd Code Status: fu;;  Family Communication: wife at bedside    Disposition Plan: home  Consults called: none  Admission status: obs    Dyanne Carrel M MD [facility]  If 7PM-7AM, please contact night-coverage [email or web address] Password [facility]  09/26/2015, 4:34 PM

## 2015-09-27 ENCOUNTER — Observation Stay (HOSPITAL_COMMUNITY): Payer: PRIVATE HEALTH INSURANCE

## 2015-09-27 ENCOUNTER — Other Ambulatory Visit: Payer: Self-pay | Admitting: Cardiology

## 2015-09-27 ENCOUNTER — Observation Stay (HOSPITAL_BASED_OUTPATIENT_CLINIC_OR_DEPARTMENT_OTHER): Payer: PRIVATE HEALTH INSURANCE

## 2015-09-27 DIAGNOSIS — R55 Syncope and collapse: Secondary | ICD-10-CM

## 2015-09-27 DIAGNOSIS — I82432 Acute embolism and thrombosis of left popliteal vein: Secondary | ICD-10-CM | POA: Diagnosis not present

## 2015-09-27 HISTORY — PX: TRANSTHORACIC ECHOCARDIOGRAM: SHX275

## 2015-09-27 LAB — ECHOCARDIOGRAM COMPLETE
HEIGHTINCHES: 68.5 in
Weight: 2942.4 oz

## 2015-09-27 LAB — CK TOTAL AND CKMB (NOT AT ARMC)
CK TOTAL: 92 U/L (ref 49–397)
CK, MB: 2.2 ng/mL (ref 0.5–5.0)
Relative Index: INVALID (ref 0.0–2.5)

## 2015-09-27 MED ORDER — HEPARIN SODIUM (PORCINE) 5000 UNIT/ML IJ SOLN
5000.0000 [IU] | Freq: Three times a day (TID) | INTRAMUSCULAR | Status: DC
  Filled 2015-09-27: qty 1

## 2015-09-27 MED ORDER — SODIUM CHLORIDE 0.9 % IV SOLN
INTRAVENOUS | Status: DC
  Administered 2015-09-27 – 2015-09-29 (×3): via INTRAVENOUS

## 2015-09-27 NOTE — Progress Notes (Signed)
PROGRESS NOTE                                                                                                                                                                                                             Patient Demographics:    Dwayne Harper, is a 70 y.o. male, DOB - January 17, 1946, [id number]  Admit date - 09/26/2015   Admitting Physician Lily Kocher, MD  Outpatient Primary MD for the patient is Drema Pry, DO  LOS -    Chief Complaint  Patient presents with  . Fall  . Head Injury       Brief Narrative  70 year old male with history of testicular cancer, who presents with syncope, admitted for further workup.   Subjective:    Dwayne Harper today has, No headache, No chest pain, No abdominal pain - No Nausea, No new weakness tingling or numbness, No Cough - SOB.   Assessment  & Plan :    Principal Problem:   Syncope and collapse Active Problems:   History of Hodgkin's disease   Scalp hematoma   Acute encephalopathy   Thrombocytopenia (HCC)   Concussion with no loss of consciousness  Syncope - Unclear etiology, EKG without acute abnormality, no significant lab abnormalities, 2-D echo with normal EF, no valvular disease, no regional wall motion abnormality, not orthostatic, EEG done, reading pending, will check bilateral carotid Dopplers, will check MRI brain without contrast, continue on telemetry monitor, discussed with cardiology PA to arrange for 30 days Holter monitor as an outpatient, continue with IV fluids.   Acute encephalopathy - This is most likely related to concussion, mentation back to baseline, will check MRI brain  Scalp hematoma - Sutured by ED staff       Code Status : Full  Family Communication  : wife at bedside  Disposition Plan  : Home when stable  Consults  :  None  Procedures  : None  DVT Prophylaxis  :  Heparin   Lab Results  Component Value Date   PLT 131* 09/26/2015     Antibiotics  :    Anti-infectives    None        Objective:   Filed Vitals:   09/26/15 2015 09/27/15 0044 09/27/15 0507 09/27/15 1209  BP: 133/79 116/70 98/85 126/77  Pulse: 91 84 85 76  Temp: 97.4  F (36.3 C) 97.6 F (36.4 C) 97.5 F (36.4 C) 98.5 F (36.9 C)  TempSrc: Oral Oral Oral Oral  Resp: 16 16 16 20   Height:      Weight:   83.416 kg (183 lb 14.4 oz)   SpO2: 95% 98% 98% 91%    Wt Readings from Last 3 Encounters:  09/27/15 83.416 kg (183 lb 14.4 oz)  01/09/09 89.245 kg (196 lb 12 oz)  03/07/08 86.81 kg (191 lb 6.1 oz)     Intake/Output Summary (Last 24 hours) at 09/27/15 1422 Last data filed at 09/27/15 1323  Gross per 24 hour  Intake   1200 ml  Output    900 ml  Net    300 ml     Physical Exam  Awake Alert, Oriented X 3,  [name].AT,PERRAL Supple Neck,No JVD, No cervical lymphadenopathy appriciated.  Symmetrical Chest wall movement, Good air movement bilaterally, CTAB RRR,No Gallops,Rubs or new Murmurs, No Parasternal Heave +ve B.Sounds, Abd Soft, No tenderness, No organomegaly appriciated, No rebound - guarding or rigidity. No Cyanosis, Clubbing or edema, No new Rash or bruise      Data Review:    CBC  Recent Labs Lab 09/26/15 1230  WBC 9.7  HGB 15.6  HCT 45.5  PLT 131*  MCV 91.0  MCH 31.2  MCHC 34.3  RDW 12.2  LYMPHSABS 1.5  MONOABS 0.8  EOSABS 0.2  BASOSABS 0.0    Chemistries   Recent Labs Lab 09/26/15 1230  NA 138  K 4.1  CL 105  CO2 26  GLUCOSE 111*  BUN 18  CREATININE 0.95  CALCIUM 9.2  AST 32  ALT 27  ALKPHOS 50  BILITOT 0.6   ------------------------------------------------------------------------------------------------------------------ No results for input(s): CHOL, HDL, LDLCALC, TRIG, CHOLHDL, LDLDIRECT in the last 72 hours.  No results found for: HGBA1C ------------------------------------------------------------------------------------------------------------------ No results for input(s): TSH,  T4TOTAL, T3FREE, THYROIDAB in the last 72 hours.  Invalid input(s): FREET3 ------------------------------------------------------------------------------------------------------------------ No results for input(s): VITAMINB12, FOLATE, FERRITIN, TIBC, IRON, RETICCTPCT in the last 72 hours.  Coagulation profile No results for input(s): INR, PROTIME in the last 168 hours.  No results for input(s): DDIMER in the last 72 hours.  Cardiac Enzymes  Recent Labs Lab 09/27/15 1103  CKMB 2.2   ------------------------------------------------------------------------------------------------------------------ No results found for: BNP  Inpatient Medications  Scheduled Meds: . aspirin EC  81 mg Oral Daily  . pantoprazole  40 mg Oral Daily  . sodium chloride flush  3 mL Intravenous Q12H   Continuous Infusions: . sodium chloride 75 mL/hr at 09/27/15 1112   PRN Meds:.acetaminophen **OR** acetaminophen, alum & mag hydroxide-simeth, bisacodyl, HYDROcodone-acetaminophen, ondansetron **OR** ondansetron (ZOFRAN) IV  Micro Results No results found for this or any previous visit (from the past 240 hour(s)).  Radiology Reports Ct Head Wo Contrast  09/26/2015  CLINICAL DATA:  Pain following fall.  Transient memory loss of event EXAM: CT HEAD WITHOUT CONTRAST TECHNIQUE: Contiguous axial images were obtained from the base of the skull through the vertex without intravenous contrast. COMPARISON:  None. FINDINGS: Brain: There is age related volume loss. There is no intracranial mass, hemorrhage, extra-axial fluid collection, or midline shift. The gray-white compartments appear normal. No acute infarct evident. Vascular: There are foci of vascular calcification in left carotid siphon and cavernous carotid artery regions on the left. There are no hyperdense vessels. Skull: The bony calvarium appears intact. There is a left parietal scalp hematoma. Sinuses/Orbits: Visualized orbits appear symmetric bilaterally.  Visualized paranasal sinuses are clear. Other: Visualized  mastoid air cells are clear. IMPRESSION: Age related volume loss. No intracranial mass, hemorrhage, or extra-axial fluid collection. Gray-white compartments appear normal. Mild calcification in left carotid siphon and cavernous carotid artery regions. There is a left parietal scalp hematoma. Electronically Signed   By: Lowella Grip III M.D.   On: 09/26/2015 13:22   Dg Chest Port 1 View  09/26/2015  CLINICAL DATA:  Syncope, collapse EXAM: PORTABLE CHEST 1 VIEW COMPARISON:  09/21/2011 FINDINGS: Cardiomediastinal silhouette is stable. No acute infiltrate or pleural effusion. No pulmonary edema. Atherosclerotic calcifications of thoracic aorta. IMPRESSION: No active disease. Electronically Signed   By: Lahoma Crocker M.D.   On: 09/26/2015 17:16     ,  M.D on 09/27/2015 at 2:22 PM  Between 7am to 7pm - Pager - [phone]  After 7pm go to [email or web address] - password [facility]  [facility] -  Office  [phone]

## 2015-09-27 NOTE — Progress Notes (Signed)
VASCULAR LAB PRELIMINARY  PRELIMINARY  PRELIMINARY  PRELIMINARY  Carotid duplex completed.    Preliminary report:  1-39% ICA plaquing.  Vertebral artery flow is antegrade.   , , RVT 09/27/2015, 4:24 PM

## 2015-09-27 NOTE — Progress Notes (Signed)
EEG completed, results pending. 

## 2015-09-27 NOTE — Progress Notes (Signed)
Echocardiogram 2D Echocardiogram has been performed.  Aggie Cosier 09/27/2015, 8:23 AM

## 2015-09-27 NOTE — Procedures (Signed)
EEG Report  Clinical history:  Concussion with altered mental status.  Technical Summary:  A 19  Channel digital EEG recording was performed using the 10-20 international system of electrode placement.  Bipolar and referential montages were used.  The total recording time was approx 20 minutes.  Findings:  There is a posterior dominant rhythm of 9 Hz reactive to eye opening and closure.  Hyperventilation and photic stimulation were not performed.  Sleep was not recorded.  Throughout the recording, there was no focal slowing, epileptiform discharges, or electrographic seizures present.    Impression:  This is a normal EEG in the awake state.  There is no evidence of a seizure tendency on this recording, however, it does not rule out underlying epilepsy.  If warranted, a sleep deprived EEG and/or a more prolonged EEG may be of additional value.   Rogue Jury, MS, MD

## 2015-09-28 ENCOUNTER — Encounter (HOSPITAL_COMMUNITY): Payer: Self-pay | Admitting: *Deleted

## 2015-09-28 ENCOUNTER — Observation Stay (HOSPITAL_COMMUNITY): Payer: PRIVATE HEALTH INSURANCE

## 2015-09-28 ENCOUNTER — Observation Stay (HOSPITAL_BASED_OUTPATIENT_CLINIC_OR_DEPARTMENT_OTHER): Payer: PRIVATE HEALTH INSURANCE

## 2015-09-28 DIAGNOSIS — R7989 Other specified abnormal findings of blood chemistry: Secondary | ICD-10-CM

## 2015-09-28 DIAGNOSIS — I82432 Acute embolism and thrombosis of left popliteal vein: Secondary | ICD-10-CM | POA: Diagnosis not present

## 2015-09-28 DIAGNOSIS — R55 Syncope and collapse: Secondary | ICD-10-CM

## 2015-09-28 DIAGNOSIS — I82442 Acute embolism and thrombosis of left tibial vein: Secondary | ICD-10-CM

## 2015-09-28 LAB — BASIC METABOLIC PANEL
Anion gap: 3 — ABNORMAL LOW (ref 5–15)
BUN: 14 mg/dL (ref 6–20)
CHLORIDE: 110 mmol/L (ref 101–111)
CO2: 25 mmol/L (ref 22–32)
Calcium: 8.1 mg/dL — ABNORMAL LOW (ref 8.9–10.3)
Creatinine, Ser: 0.9 mg/dL (ref 0.61–1.24)
GFR calc Af Amer: >60 mL/min (ref >60)
GFR calc non Af Amer: >60 mL/min (ref >60)
GLUCOSE: 103 mg/dL — AB (ref 65–99)
POTASSIUM: 4.6 mmol/L (ref 3.5–5.1)
Sodium: 138 mmol/L (ref 135–145)

## 2015-09-28 LAB — VAS US CAROTID
LCCADDIAS: -20 cm/s
LCCAPSYS: 129 cm/s
LEFT ECA DIAS: -13 cm/s
LEFT VERTEBRAL DIAS: -16 cm/s
LICADSYS: -70 cm/s
Left CCA dist sys: -95 cm/s
Left CCA prox dias: 25 cm/s
Left ICA dist dias: -28 cm/s
Left ICA prox dias: -29 cm/s
Left ICA prox sys: -82 cm/s
RCCADSYS: -76 cm/s
RCCAPDIAS: -13 cm/s
RIGHT ECA DIAS: -11 cm/s
RIGHT VERTEBRAL DIAS: 9 cm/s
Right CCA prox sys: -116 cm/s

## 2015-09-28 LAB — HEPARIN LEVEL (UNFRACTIONATED): Heparin Unfractionated: 1.18 IU/mL — ABNORMAL HIGH (ref 0.30–0.70)

## 2015-09-28 LAB — CBC
HEMATOCRIT: 39.9 % (ref 39.0–52.0)
HEMOGLOBIN: 13.4 g/dL (ref 13.0–17.0)
MCH: 30.7 pg (ref 26.0–34.0)
MCHC: 33.6 g/dL (ref 30.0–36.0)
MCV: 91.5 fL (ref 78.0–100.0)
PLATELETS: 122 10*3/uL — AB (ref 150–400)
RBC: 4.36 MIL/uL (ref 4.22–5.81)
RDW: 12.3 % (ref 11.5–15.5)
WBC: 6.9 10*3/uL (ref 4.0–10.5)

## 2015-09-28 LAB — D-DIMER, QUANTITATIVE (NOT AT ARMC): D DIMER QUANT: 7.07 ug{FEU}/mL — AB (ref 0.00–0.50)

## 2015-09-28 MED ORDER — HEPARIN (PORCINE) IN NACL 100-0.45 UNIT/ML-% IJ SOLN
900.0000 [IU]/h | INTRAMUSCULAR | Status: AC
  Administered 2015-09-29: 1050 [IU]/h via INTRAVENOUS
  Filled 2015-09-28: qty 250

## 2015-09-28 MED ORDER — HEPARIN (PORCINE) IN NACL 100-0.45 UNIT/ML-% IJ SOLN
1250.0000 [IU]/h | INTRAMUSCULAR | Status: DC
  Administered 2015-09-28: 1250 [IU]/h via INTRAVENOUS
  Filled 2015-09-28: qty 250

## 2015-09-28 MED ORDER — IOPAMIDOL (ISOVUE-370) INJECTION 76%
INTRAVENOUS | Status: AC
  Administered 2015-09-28: 100 mL
  Filled 2015-09-28: qty 100

## 2015-09-28 MED ORDER — HEPARIN BOLUS VIA INFUSION
4000.0000 [IU] | Freq: Once | INTRAVENOUS | Status: AC
  Administered 2015-09-28: 4000 [IU] via INTRAVENOUS
  Filled 2015-09-28: qty 4000

## 2015-09-28 NOTE — Progress Notes (Signed)
ANTICOAGULATION CONSULT NOTE - Initial Consult  Pharmacy Consult for Heparin Indication: DVT, rule out PE  Allergies  Allergen Reactions  . Lactose Intolerance (Gi)   . Peanut-Containing Drug Products Nausea And Vomiting  . Penicillins     Childhood ; Mother has a severe reaction     Patient Measurements: Height: 5' 8.5" (174 cm) Weight: 183 lb 14.4 oz (83.4 kg) IBW/kg (Calculated) : 69.55  Vital Signs: Temp: 97.7 F (36.5 C) (07/23 1225) Temp Source: Oral (07/23 1225) BP: 142/76 (07/23 1225) Pulse Rate: 71 (07/23 1225)  Labs:  Recent Labs  09/26/15 1230 09/27/15 1103 09/28/15 0226  HGB 15.6  --  13.4  HCT 45.5  --  39.9  PLT 131*  --  122*  CREATININE 0.95  --  0.90  CKTOTAL  --  92  --   CKMB  --  2.2  --     Estimated Creatinine Clearance: 78.4 mL/min (by C-G formula based on SCr of 0.9 mg/dL).   Medical History: Past Medical History:  Diagnosis Date  . Cancer ([place])   . Gout   . Scalp hematoma   . Syncope     Assessment: 70 year old male to begin heparin for new DVT, rule out PE  Goal of Therapy:  Heparin level 0.3-0.7 units/ml Monitor platelets by anticoagulation protocol: Yes   Plan:  Heparin 4000 units iv bolus x 1 Heparin drip at 1250 units / hr Heparin level 6 hours after heparin starts Daily heparin level, CBC  Thank you Anette Guarneri, PharmD [phone]  09/28/2015,1:00 PM

## 2015-09-28 NOTE — Progress Notes (Signed)
ANTICOAGULATION CONSULT NOTE - Initial Consult  Pharmacy Consult for Heparin Indication: DVT, rule out PE  Allergies  Allergen Reactions  . Lactose Intolerance (Gi)   . Peanut-Containing Drug Products Nausea And Vomiting  . Penicillins     Childhood ; Mother has a severe reaction     Patient Measurements: Height: 5' 8.5" (174 cm) Weight: 183 lb 14.4 oz (83.4 kg) IBW/kg (Calculated) : 69.55  Vital Signs: Temp: 97.6 F (36.4 C) (07/23 2050) Temp Source: Oral (07/23 2050) BP: 132/75 (07/23 2050) Pulse Rate: 82 (07/23 2050)  Labs:  Recent Labs  09/26/15 1230 09/27/15 1103 09/28/15 0226 09/28/15 2053  HGB 15.6  --  13.4  --   HCT 45.5  --  39.9  --   PLT 131*  --  122*  --   HEPARINUNFRC  --   --   --  1.18*  CREATININE 0.95  --  0.90  --   CKTOTAL  --  92  --   --   CKMB  --  2.2  --   --     Estimated Creatinine Clearance: 78.4 mL/min (by C-G formula based on SCr of 0.9 mg/dL).   Medical History: Past Medical History:  Diagnosis Date  . Cancer ([place])   . Gout   . Scalp hematoma   . Syncope     Assessment: 70 year old male to begin heparin for new DVT, rule out PE  Goal of Therapy:  Heparin level 0.3-0.7 units/ml Monitor platelets by anticoagulation protocol: Yes   Plan:  Heparin 4000 units iv bolus x 1 Heparin drip at 1250 units / hr Heparin level 6 hours after heparin starts Daily heparin level, CBC  Thank you Anette Guarneri, PharmD [phone]  Addendum: Initial heparin level elevated at 1.18 Hold heparin drip x 1 hour then decrease to 1050 units /hr 6 hr heparin level  Thank you Anette Guarneri, PharmD [phone]  09/28/2015,10:28 PM

## 2015-09-28 NOTE — Progress Notes (Signed)
*  Preliminary Results* Bilateral lower extremity venous duplex completed. Right lower extremity is negative for deep vein thrombosis.  The left lower extremity is positive for acute deep vein thrombosis involving the left popliteal vein and a single left posterior tibial vein. The thrombus in the left popliteal vein is mobile.  There is no evidence of Baker's cyst bilaterally.  Preliminary results discussed with Dr. Waldron Labs.  Preliminary 09/28/2015  Maudry Mayhew, RVT, RDCS, RDMS

## 2015-09-28 NOTE — Progress Notes (Signed)
PROGRESS NOTE                                                                                                                                                                                                             Patient Demographics:    Dwayne Harper, is a 70 y.o. male, DOB - 19-Jun-1945, [id number]  Admit date - 09/26/2015   Admitting Physician Lily Kocher, MD  Outpatient Primary MD for the patient is Drema Pry, DO  LOS - 0   Chief Complaint  Patient presents with  . Fall  . Head Injury       Brief Narrative  69 year old male with history of testicular cancer, who presents with syncope, admitted for further workup, Patients reported reports recent travel, with elevated d-dimer as, left lower extremity positive for DVT.   Subjective:    Dwayne Harper today has, No headache, No chest pain, No abdominal pain - No Nausea, No cough or shortness of breath.  Assessment  & Plan :    Principal Problem:   Syncope and collapse Active Problems:   History of Hodgkin's disease   Scalp hematoma   Acute encephalopathy   Thrombocytopenia (HCC)   Concussion with no loss of consciousness  Syncope - Unclear etiology, EKG without acute abnormality, no significant lab abnormalities, No evidence on telemetry,2-D echo with normal EF, no valvular disease, no regional wall motion abnormality, not orthostatic, EEG with No evidence of seizure activities  will check bilateral carotid Dopplers with no significant stenosis, MRI brain with no acute findings  continue on telemetry monitor, discussed with cardiology PA to arrange for 30 days Holter monitor as an outpatient. - Agent with elevated d-dimer, venous Doppler significant for DVT, hypermobility for PE, CT chest pending to rule out PE.  Acute DVT left lower extremity - Will start on heparin GTT   Acute encephalopathy - This is most likely related to concussion, mentation back to baseline, will  check MRI brain  Scalp hematoma - Sutured by ED staff    Code Status : Full  Family Communication  : None at bedside  Disposition Plan  : Home when stable  Consults  :  None  Procedures  : None  DVT Prophylaxis  :  Heparin   Lab Results  Component Value Date   PLT 122 (L) 09/28/2015  Antibiotics  :    Anti-infectives    None        Objective:   Vitals:   09/27/15 1209 09/27/15 2031 09/28/15 0531 09/28/15 1225  BP: 126/77 140/76 103/83 (!) 142/76  Pulse: 76 78 79 71  Resp: 20 20 20 20   Temp: 98.5 F (36.9 C) 98.6 F (37 C) 98.2 F (36.8 C) 97.7 F (36.5 C)  TempSrc: Oral Oral Oral Oral  SpO2: 91% 93% 93% 96%  Weight:   83.4 kg (183 lb 14.4 oz)   Height:        Wt Readings from Last 3 Encounters:  09/28/15 83.4 kg (183 lb 14.4 oz)  01/09/09 89.2 kg (196 lb 12 oz)  03/07/08 86.8 kg (191 lb 6.1 oz)     Intake/Output Summary (Last 24 hours) at 09/28/15 1337 Last data filed at 09/28/15 0905  Gross per 24 hour  Intake             2130 ml  Output                0 ml  Net             2130 ml     Physical Exam  Awake Alert, Oriented X 3,  [place].AT,PERRAL Supple Neck,No JVD, No cervical lymphadenopathy appriciated.  Symmetrical Chest wall movement, Good air movement bilaterally, CTAB RRR,No Gallops,Rubs or new Murmurs, No Parasternal Heave +ve B.Sounds, Abd Soft, No tenderness, No organomegaly appriciated, No rebound - guarding or rigidity. No Cyanosis, Clubbing or edema, No new Rash or bruise      Data Review:    CBC  Recent Labs Lab 09/26/15 1230 09/28/15 0226  WBC 9.7 6.9  HGB 15.6 13.4  HCT 45.5 39.9  PLT 131* 122*  MCV 91.0 91.5  MCH 31.2 30.7  MCHC 34.3 33.6  RDW 12.2 12.3  LYMPHSABS 1.5  --   MONOABS 0.8  --   EOSABS 0.2  --   BASOSABS 0.0  --     Chemistries   Recent Labs Lab 09/26/15 1230 09/28/15 0226  NA 138 138  K 4.1 4.6  CL 105 110  CO2 26 25  GLUCOSE 111* 103*  BUN 18 14  CREATININE 0.95 0.90  CALCIUM  9.2 8.1*  AST 32  --   ALT 27  --   ALKPHOS 50  --   BILITOT 0.6  --    ------------------------------------------------------------------------------------------------------------------ No results for input(s): CHOL, HDL, LDLCALC, TRIG, CHOLHDL, LDLDIRECT in the last 72 hours.  No results found for: HGBA1C ------------------------------------------------------------------------------------------------------------------ No results for input(s): TSH, T4TOTAL, T3FREE, THYROIDAB in the last 72 hours.  Invalid input(s): FREET3 ------------------------------------------------------------------------------------------------------------------ No results for input(s): VITAMINB12, FOLATE, FERRITIN, TIBC, IRON, RETICCTPCT in the last 72 hours.  Coagulation profile No results for input(s): INR, PROTIME in the last 168 hours.   Recent Labs  09/28/15 0951  DDIMER 7.07*    Cardiac Enzymes  Recent Labs Lab 09/27/15 1103  CKMB 2.2   ------------------------------------------------------------------------------------------------------------------ No results found for: BNP  Inpatient Medications  Scheduled Meds: . aspirin EC  81 mg Oral Daily  . heparin  4,000 Units Intravenous Once  . pantoprazole  40 mg Oral Daily  . sodium chloride flush  3 mL Intravenous Q12H   Continuous Infusions: . sodium chloride 75 mL/hr at 09/28/15 0536  . heparin     PRN Meds:.acetaminophen **OR** acetaminophen, alum & mag hydroxide-simeth, bisacodyl, HYDROcodone-acetaminophen, ondansetron **OR** ondansetron (ZOFRAN) IV  Micro Results No results found for this  or any previous visit (from the past 240 hour(s)).  Radiology Reports Ct Head Wo Contrast  Result Date: 09/26/2015 CLINICAL DATA:  Pain following fall.  Transient memory loss of event EXAM: CT HEAD WITHOUT CONTRAST TECHNIQUE: Contiguous axial images were obtained from the base of the skull through the vertex without intravenous contrast.  COMPARISON:  None. FINDINGS: Brain: There is age related volume loss. There is no intracranial mass, hemorrhage, extra-axial fluid collection, or midline shift. The gray-white compartments appear normal. No acute infarct evident. Vascular: There are foci of vascular calcification in left carotid siphon and cavernous carotid artery regions on the left. There are no hyperdense vessels. Skull: The bony calvarium appears intact. There is a left parietal scalp hematoma. Sinuses/Orbits: Visualized orbits appear symmetric bilaterally. Visualized paranasal sinuses are clear. Other: Visualized mastoid air cells are clear. IMPRESSION: Age related volume loss. No intracranial mass, hemorrhage, or extra-axial fluid collection. Gray-white compartments appear normal. Mild calcification in left carotid siphon and cavernous carotid artery regions. There is a left parietal scalp hematoma. Electronically Signed   By: Lowella Grip III M.D.   On: 09/26/2015 13:22   Mr Brain Wo Contrast  Result Date: 09/27/2015 CLINICAL DATA:  Syncopal episode with laceration to the head and memory loss. EXAM: MRI HEAD WITHOUT CONTRAST TECHNIQUE: Multiplanar, multiecho pulse sequences of the brain and surrounding structures were obtained without intravenous contrast. COMPARISON:  Head CT 09/26/2015 FINDINGS: Fusion imaging does not show any acute or subacute infarction. The brainstem and cerebellum are normal. There are several punctate foci of T2 and FLAIR signal within the cerebral hemispheric white matter consistent with mild small vessel change. No cortical or large vessel territory infarction. No mass lesion, hemorrhage, hydrocephalus or extra-axial collection left parietal scalp injury without evidence of underlying skull fracture or intracranial injury no pituitary mass. No fluid in the sinuses. Major vessels at base of the brain show flow. IMPRESSION: No acute or significant finding. 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 Electronically Signed   By: Nelson Chimes M.D.   On: 09/27/2015 16:13   Dg Chest Port 1 View  Result Date: 09/26/2015 CLINICAL DATA:  Syncope, collapse EXAM: PORTABLE CHEST 1 VIEW COMPARISON:  09/21/2011 FINDINGS: Cardiomediastinal silhouette is stable. No acute infiltrate or pleural effusion. No pulmonary edema. Atherosclerotic calcifications of thoracic aorta. IMPRESSION: No active disease. Electronically Signed   By: Lahoma Crocker M.D.   On: 09/26/2015 17:16     ,  M.D on 09/28/2015 at 1:37 PM  Between 7am to 7pm - Pager - [phone]  After 7pm go to [email or web address] - password [facility]  [facility] -  Office  [phone]

## 2015-09-29 DIAGNOSIS — R55 Syncope and collapse: Secondary | ICD-10-CM | POA: Diagnosis not present

## 2015-09-29 DIAGNOSIS — I82432 Acute embolism and thrombosis of left popliteal vein: Secondary | ICD-10-CM | POA: Diagnosis not present

## 2015-09-29 LAB — CBC
HCT: 39.4 % (ref 39.0–52.0)
HEMOGLOBIN: 13.5 g/dL (ref 13.0–17.0)
MCH: 30.9 pg (ref 26.0–34.0)
MCHC: 34.3 g/dL (ref 30.0–36.0)
MCV: 90.2 fL (ref 78.0–100.0)
Platelets: 122 10*3/uL — ABNORMAL LOW (ref 150–400)
RBC: 4.37 MIL/uL (ref 4.22–5.81)
RDW: 12.3 % (ref 11.5–15.5)
WBC: 7.1 10*3/uL (ref 4.0–10.5)

## 2015-09-29 LAB — GLUCOSE, CAPILLARY: GLUCOSE-CAPILLARY: 104 mg/dL — AB (ref 65–99)

## 2015-09-29 LAB — HEPARIN LEVEL (UNFRACTIONATED): HEPARIN UNFRACTIONATED: 0.87 [IU]/mL — AB (ref 0.30–0.70)

## 2015-09-29 MED ORDER — APIXABAN 5 MG PO TABS: ORAL_TABLET | ORAL | 0 refills | Status: DC

## 2015-09-29 MED ORDER — APIXABAN 5 MG PO TABS
10.0000 mg | ORAL_TABLET | Freq: Two times a day (BID) | ORAL | Status: DC
  Administered 2015-09-29: 10 mg via ORAL
  Filled 2015-09-29: qty 2

## 2015-09-29 NOTE — Discharge Instructions (Signed)
Information on my medicine - ELIQUIS (apixaban)  This medication education was reviewed with me or my healthcare representative as part of my discharge preparation.  The pharmacist that spoke with me during my hospital stay was:  Arty Baumgartner, [facility]  Why was Eliquis prescribed for you? Eliquis was prescribed to treat blood clots that may have been found in the veins of your legs (deep vein thrombosis) or in your lungs (pulmonary embolism) and to reduce the risk of them occurring again.  What do You need to know about Eliquis ? The starting dose is 10 mg (two 5 mg tablets) taken TWICE daily for the FIRST SEVEN (7) DAYS, then on 10/06/15 the dose is reduced to ONE 5 mg tablet taken TWICE daily.  Eliquis may be taken with or without food.   Try to take the dose about the same time in the morning and in the evening. If you have difficulty swallowing the tablet whole please discuss with your pharmacist how to take the medication safely.  Take Eliquis exactly as prescribed and DO NOT stop taking Eliquis without talking to the doctor who prescribed the medication.  Stopping may increase your risk of developing a new blood clot.  Refill your prescription before you run out.  After discharge, you should have regular check-up appointments with your healthcare provider that is prescribing your Eliquis.    What do you do if you miss a dose? If a dose of ELIQUIS is not taken at the scheduled time, take it as soon as possible on the same day and twice-daily administration should be resumed. The dose should not be doubled to make up for a missed dose.  Important Safety Information A possible side effect of Eliquis is bleeding. You should call your healthcare provider right away if you experience any of the following: ? Bleeding from an injury or your nose that does not stop. ? Unusual colored urine (red or dark brown) or unusual colored stools (red or black). ? Unusual bruising for unknown  reasons. ? A serious fall or if you hit your head (even if there is no bleeding).  Some medicines may interact with Eliquis and might increase your risk of bleeding or clotting while on Eliquis. To help avoid this, consult your healthcare provider or pharmacist prior to using any new prescription or non-prescription medications, including herbals, vitamins, non-steroidal anti-inflammatory drugs (NSAIDs) and supplements.  This website has more information on Eliquis (apixaban): http://[email or web address].com/eliquis/home

## 2015-09-29 NOTE — Discharge Summary (Signed)
Dwayne Harper, is a 70 y.o. male  DOB Sep 08, 1945  MRN [id number].  Admission date:  09/26/2015  Admitting Physician  Lily Kocher, MD  Discharge Date:  09/29/2015   Primary MD  Drema Pry, DO  Recommendations for primary care physician for things to follow:  - Please check CBC, BMP urine next visit. - patient will need 30 days event monitor, cardiology will arrange for patient.   Admission Diagnosis  Syncope and collapse [R55] Scalp hematoma, initial encounter [S00.03XA]   Discharge Diagnosis  Syncope and collapse [R55] Scalp hematoma, initial encounter [S00.03XA]    Principal Problem:   Syncope and collapse Active Problems:   History of Hodgkin's disease   Scalp hematoma   Acute encephalopathy   Thrombocytopenia (HCC)   Concussion with no loss of consciousness      Past Medical History:  Diagnosis Date  . Cancer ([place])   . Gout   . Scalp hematoma   . Syncope     Past Surgical History:  Procedure Laterality Date  . COLONOSCOPY    . TESTICLE REMOVAL    . TONSILLECTOMY         History of present illness and  Hospital Course:     Kindly see H&P for history of present illness and admission details, please review complete Labs, Consult reports and Test reports for all details in brief  HPI  from the history and physical done on the day of admission 09/26/2015 HPI: Dwayne Harper is a very pleasant 70 y.o. male with medical history significant for gout remote testicular cancer since to the emergency department with the chief complaint of fall/collapse and subsequent memory loss. Initial evaluation concerning for concussion related to head injury secondary to likely syncopal event.  Information is obtained from the patient and the wife is at the bedside. Of note information from patient may be unreliable as he is demonstrating short-term memory deficit. He reports last memory packing  the car in the garage in preparation for trip to the beach. His next memory is sitting on the floor of the garage with his wife telling him he needed go to the hospital. She reports she heard him calling her from the garage and she found him on the floor lying in a "pool of blood" and he was somewhat confused. It is unclear if he actually lost consciousness. Wife states she helped him up he had a steady gait as he ambulated to the car but during the ride he was disoriented to time had forgotten where they were going and was unsure of their anniversary date. He denies any chest pain palpitations headache dizziness visual disturbances. He denies any numbness or tingling of extremities. He denies shortness of breath cough fever chills lower extremity edema. He denies abdominal pain nausea vomiting diarrhea. He denies dysuria hematuria frequency or urgency. He states he's been eating and drinking his normal amount and denies any unintentional weight loss. Wife reports increased stress at his job and decreased sleep. In addition they have been home for 3  weeks several weeks prior to that they were traveling extensively in [place].    ED Course: In the emergency department he is afebrile hemodynamically stable and not hypoxic.   Hospital Course  70 year old male with history of testicular cancer, who presents with syncope, admitted for further workup, Patients reported reports recent travel, with elevated d-dimer as, left lower extremity positive for DVT.  Syncope - Unclear etiology, EKG without acute abnormality, no significant lab abnormalities, No evidence of arrhythmias on telemetry,2-D echo with normal EF, no valvular disease, no regional wall motion abnormality, not orthostatic, EEG with No evidence of seizure activities   bilateral carotid Dopplers with no significant stenosis, MRI brain with no acute findings , discussed with cardiology , they will arrange arrange for 30 days event monitor as an  outpatient. - Patient with elevated d-dimer, venous Doppler significant for DVT, CTA chest with no evidence of PE.  Acute DVT left lower extremity - Will be discharged on Eliquis, provoked as recent long distance flight, will need anticoagulation for 6 month   Acute encephalopathy - This is most likely related to concussion, mentation back to baseline, no acute finding on MRI brain  Scalp hematoma - Sutured by ED staff, staples to be taken off with PCP in one week   Discharge Condition:  stable   Follow UP  Follow-up Information    Drema Pry, DO .   Specialty:  Internal Medicine Contact information: [place] [address] [phone]             Discharge Instructions  and  Discharge Medications     Discharge Instructions    Discharge instructions    Complete by:  As directed   Follow with Primary MD Drema Pry, DO in 7 days  - Have your scalp staples removed by your PCP in one week.  Get CBC, CMP,  checked  by Primary MD next visit.    Activity: As tolerated with Full fall precautions use walker/cane & assistance as needed   Disposition Home **   Diet: Heart Healthy ** , with feeding assistance and aspiration precautions.  For Heart failure patients - Check your Weight same time everyday, if you gain over 2 pounds, or you develop in leg swelling, experience more shortness of breath or chest pain, call your Primary MD immediately. Follow Cardiac Low Salt Diet and 1.5 lit/day fluid restriction.   On your next visit with your primary care physician please Get Medicines reviewed and adjusted.   Please request your Prim.MD to go over all Hospital Tests and Procedure/Radiological results at the follow up, please get all Hospital records sent to your Prim MD by signing hospital release before you go home.   If you experience worsening of your admission symptoms, develop shortness of breath, life threatening emergency, suicidal or  homicidal thoughts you must seek medical attention immediately by calling 911 or calling your MD immediately  if symptoms less severe.  You Must read complete instructions/literature along with all the possible adverse reactions/side effects for all the Medicines you take and that have been prescribed to you. Take any new Medicines after you have completely understood and accpet all the possible adverse reactions/side effects.   Do not drive, operating heavy machinery, perform activities at heights, swimming or participation in water activities or provide baby sitting services if your were admitted for syncope or siezures until you have seen by Primary MD or a Neurologist and advised to do so again.  Do not drive  when taking Pain medications.    Do not take more than prescribed Pain, Sleep and Anxiety Medications  Special Instructions: If you have smoked or chewed Tobacco  in the last 2 yrs please stop smoking, stop any regular Alcohol  and or any Recreational drug use.  Wear Seat belts while driving.   Please note  You were cared for by a hospitalist during your hospital stay. If you have any questions about your discharge medications or the care you received while you were in the hospital after you are discharged, you can call the unit and asked to speak with the hospitalist on call if the hospitalist that took care of you is not available. Once you are discharged, your primary care physician will handle any further medical issues. Please note that NO REFILLS for any discharge medications will be authorized once you are discharged, as it is imperative that you return to your primary care physician (or establish a relationship with a primary care physician if you do not have one) for your aftercare needs so that they can reassess your need for medications and monitor your lab values.   Increase activity slowly    Complete by:  As directed       Medication List    STOP taking these  medications   aspirin 81 MG tablet   clindamycin 300 MG capsule Commonly known as:  CLEOCIN     TAKE these medications   apixaban 5 MG Tabs tablet Commonly known as:  ELIQUIS Please take 10 mg oral twice daily for 7 days, then change to 5 mg oral twice daily thereafter.   CO Q 10 PO Take 1 tablet by mouth daily.   colchicine 0.6 MG tablet Take 1 tablet (0.6 mg total) by mouth daily.   Fish Oil 1000 MG Caps Take 1 capsule by mouth daily.   glucosamine-chondroitin 500-400 MG tablet Take 1 tablet by mouth daily.   multivitamin tablet Take 1 tablet by mouth daily.   omeprazole 20 MG capsule Commonly known as:  PRILOSEC Take 20 mg by mouth daily.   VITAMIN B-12 PO Take 1 tablet by mouth daily.   VITAMIN C PO Take 1 tablet by mouth daily.         Diet and Activity recommendation: See Discharge Instructions above   Consults obtained -  None   Major procedures and Radiology Reports - PLEASE review detailed and final reports for all details, in brief -     Ct Head Wo Contrast  Result Date: 09/26/2015 CLINICAL DATA:  Pain following fall.  Transient memory loss of event EXAM: CT HEAD WITHOUT CONTRAST TECHNIQUE: Contiguous axial images were obtained from the base of the skull through the vertex without intravenous contrast. COMPARISON:  None. FINDINGS: Brain: There is age related volume loss. There is no intracranial mass, hemorrhage, extra-axial fluid collection, or midline shift. The gray-white compartments appear normal. No acute infarct evident. Vascular: There are foci of vascular calcification in left carotid siphon and cavernous carotid artery regions on the left. There are no hyperdense vessels. Skull: The bony calvarium appears intact. There is a left parietal scalp hematoma. Sinuses/Orbits: Visualized orbits appear symmetric bilaterally. Visualized paranasal sinuses are clear. Other: Visualized mastoid air cells are clear. IMPRESSION: Age related volume loss. No  intracranial mass, hemorrhage, or extra-axial fluid collection. Gray-white compartments appear normal. Mild calcification in left carotid siphon and cavernous carotid artery regions. There is a left parietal scalp hematoma. Electronically Signed   By: Lowella Grip  III M.D.   On: 09/26/2015 13:22   Ct Angio Chest Pe W Or Wo Contrast  Result Date: 09/28/2015 CLINICAL DATA:  70 year old male with syncope, recent history of lung distant travel and positive D-dimer. Evaluate for pulmonary embolism EXAM: CT ANGIOGRAPHY CHEST WITH CONTRAST TECHNIQUE: Multidetector CT imaging of the chest was performed using the standard protocol during bolus administration of intravenous contrast. Multiplanar CT image reconstructions and MIPs were obtained to evaluate the vascular anatomy. CONTRAST:  100 mL Isovue 370 COMPARISON:  Chest x-ray 09/26/2015 FINDINGS: Mediastinum: Unremarkable CT appearance of the thyroid gland. No suspicious mediastinal or hilar adenopathy. No soft tissue mediastinal mass. Small hiatal hernia. Heart/Vascular: Adequate opacification of the pulmonary arteries to the proximal subsegmental level. No evidence of acute pulmonary embolus. Conventional 3 vessel arch anatomy. No aneurysm or dissection. The heart is normal in size. Calcifications are present along the left anterior descending and circumflex coronary arteries. Lungs/Pleura: Mild dependent atelectasis. Respiratory motion artifact slightly limits evaluation for small pulmonary nodules. No focal airspace consolidation, pleural effusion, pulmonary edema or pneumothorax. Bones/Soft Tissues: No acute fracture or aggressive appearing lytic or blastic osseous lesion. Upper Abdomen: Small stone noted in the gallbladder neck. Duodenum diverticulum. Otherwise, unremarkable visualized upper abdomen. Review of the MIP images confirms the above findings. IMPRESSION: 1. Negative for acute pulmonary embolus, pneumonia or other acute cardiopulmonary process. 2.  Calcifications along the course of the left anterior descending and circumflex coronary arteries. Please note that although the presence of coronary artery calcium documents the presence of coronary artery disease, the severity of this disease and any potential stenosis cannot be assessed on this non-gated CT examination. Assessment for potential risk factor modification, dietary therapy or pharmacologic therapy may be warranted, if clinically indicated. 3. Mild dependent atelectasis. 4. Small hiatal hernia. 5. Cholelithiasis. Electronically Signed   By: Jacqulynn Cadet M.D.   On: 09/28/2015 13:53  Mr Brain Wo Contrast  Result Date: 09/27/2015 CLINICAL DATA:  Syncopal episode with laceration to the head and memory loss. EXAM: MRI HEAD WITHOUT CONTRAST TECHNIQUE: Multiplanar, multiecho pulse sequences of the brain and surrounding structures were obtained without intravenous contrast. COMPARISON:  Head CT 09/26/2015 FINDINGS: Fusion imaging does not show any acute or subacute infarction. The brainstem and cerebellum are normal. There are several punctate foci of T2 and FLAIR signal within the cerebral hemispheric white matter consistent with mild small vessel change. No cortical or large vessel territory infarction. No mass lesion, hemorrhage, hydrocephalus or extra-axial collection left parietal scalp injury without evidence of underlying skull fracture or intracranial injury no pituitary mass. No fluid in the sinuses. Major vessels at base of the brain show flow. IMPRESSION: No acute or significant finding. Mild chronic small-vessel ischemic change of the hemispheric white matter, often seen in persons of this age. Electronically Signed   By: Nelson Chimes M.D.   On: 09/27/2015 16:13   Dg Chest Port 1 View  Result Date: 09/26/2015 CLINICAL DATA:  Syncope, collapse EXAM: PORTABLE CHEST 1 VIEW COMPARISON:  09/21/2011 FINDINGS: Cardiomediastinal silhouette is stable. No acute infiltrate or pleural effusion. No  pulmonary edema. Atherosclerotic calcifications of thoracic aorta. IMPRESSION: No active disease. Electronically Signed   By: Lahoma Crocker M.D.   On: 09/26/2015 17:16    Micro Results     No results found for this or any previous visit (from the past 240 hour(s)).     Today   Subjective:   Dwayne Harper today has no headache,no chest abdominal pain,no new weakness tingling or  numbness, feels much better wants to go home today.   Objective:   Blood pressure 117/66, pulse 71, temperature 98.4 F (36.9 C), temperature source Oral, resp. rate 18, height 5' 8.5" (1.74 m), weight 83.2 kg (183 lb 6.4 oz), SpO2 94 %.   Intake/Output Summary (Last 24 hours) at 09/29/15 1245 Last data filed at 09/29/15 1026  Gross per 24 hour  Intake          1833.43 ml  Output              600 ml  Net          1233.43 ml    Exam Awake Alert, Oriented x 3, No new F.N deficits, Normal affect [place].AT,PERRAL Supple Neck,No JVD, No cervical lymphadenopathy appriciated.  Symmetrical Chest wall movement, Good air movement bilaterally, CTAB RRR,No Gallops,Rubs or new Murmurs, No Parasternal Heave +ve B.Sounds, Abd Soft, Non tender, No organomegaly appriciated, No rebound -guarding or rigidity. No Cyanosis, Clubbing or edema, No new Rash or bruise  Data Review   CBC w Diff: Lab Results  Component Value Date   WBC 7.1 09/29/2015   HGB 13.5 09/29/2015   HCT 39.4 09/29/2015   PLT 122 (L) 09/29/2015   LYMPHOPCT 15 09/26/2015   MONOPCT 8 09/26/2015   EOSPCT 2 09/26/2015   BASOPCT 0 09/26/2015    CMP: Lab Results  Component Value Date   NA 138 09/28/2015   K 4.6 09/28/2015   CL 110 09/28/2015   CO2 25 09/28/2015   BUN 14 09/28/2015   CREATININE 0.90 09/28/2015   PROT 7.1 09/26/2015   ALBUMIN 3.9 09/26/2015   BILITOT 0.6 09/26/2015   ALKPHOS 50 09/26/2015   AST 32 09/26/2015   ALT 27 09/26/2015  .   Total Time in preparing paper work, data evaluation and todays exam - 35  minutes  ,  M.D on 09/29/2015 at 12:45 PM  [facility]   Office  [phone]

## 2015-09-29 NOTE — Progress Notes (Signed)
Benefit check for Eliquis   Memory Argue CMA        S/W JACKIE @ [facility] # [phone]   ELIQUIS 5 MG BID ( 30 )   COVER- YES  CO-PAY-$ 45.00  TIER- 3 DRUG  PRIOR APPROVAL - NO  PHARMACY : ANY RETAIL

## 2015-09-29 NOTE — Progress Notes (Signed)
Eliquis coupon card given as requested. Mindi Slicker [facility] [phone]

## 2015-09-29 NOTE — Progress Notes (Signed)
Patient tolerated ambulation well, no complaints of dizziness or shortness of breath Jenne Campus N 11:23 AM 09-29-2014

## 2015-09-30 ENCOUNTER — Other Ambulatory Visit: Payer: Self-pay | Admitting: Cardiology

## 2015-09-30 ENCOUNTER — Encounter: Payer: Self-pay | Admitting: Internal Medicine

## 2015-09-30 ENCOUNTER — Ambulatory Visit (INDEPENDENT_AMBULATORY_CARE_PROVIDER_SITE_OTHER): Payer: PRIVATE HEALTH INSURANCE

## 2015-09-30 ENCOUNTER — Telehealth: Payer: Self-pay | Admitting: Internal Medicine

## 2015-09-30 ENCOUNTER — Telehealth: Payer: Self-pay

## 2015-09-30 DIAGNOSIS — R55 Syncope and collapse: Secondary | ICD-10-CM

## 2015-09-30 DIAGNOSIS — I82431 Acute embolism and thrombosis of right popliteal vein: Secondary | ICD-10-CM | POA: Diagnosis not present

## 2015-09-30 NOTE — Telephone Encounter (Signed)
Please call.

## 2015-09-30 NOTE — Telephone Encounter (Signed)
Pt in office to have monitor applied.  Pt was discharged from the hospital yesterday, and began taking Eliquis per hospital discharge orders. States he woke up this am with a red, warm, itchy localized rash on his right buttock and was worried that it may be an adverse reaction to the Eliquis. Rash currently appears as four small approximately quarter-sized spots that are unraised and light pink in color. Pt states that the rash feels, "much better" and denies any burning or itching. Denies any itching or swelling of the mouth, tongue, or airway at any time. DOD, Dr. Irish Lack, made aware of pt's complaints and recommends that the pt continue to take Eliquis for now, and to call us back if symptoms worsen. If so, pt may need to be switched to Xarelto. Pt verbalizes understanding with this plan. Pt also reminded to take benadryl if symptoms worsen.

## 2015-09-30 NOTE — Telephone Encounter (Signed)
Called pt. Patient states that he called yesterday when released from hospital for sooner appt for heart monitor placement.

## 2015-10-22 ENCOUNTER — Ambulatory Visit (INDEPENDENT_AMBULATORY_CARE_PROVIDER_SITE_OTHER): Payer: PRIVATE HEALTH INSURANCE | Admitting: Internal Medicine

## 2015-10-22 ENCOUNTER — Encounter (INDEPENDENT_AMBULATORY_CARE_PROVIDER_SITE_OTHER): Payer: Self-pay

## 2015-10-22 VITALS — BP 124/80 | HR 70 | Ht 68.5 in | Wt 186.0 lb

## 2015-10-22 DIAGNOSIS — R55 Syncope and collapse: Secondary | ICD-10-CM | POA: Diagnosis not present

## 2015-10-22 DIAGNOSIS — R0602 Shortness of breath: Secondary | ICD-10-CM | POA: Diagnosis not present

## 2015-10-22 NOTE — Progress Notes (Signed)
Electrophysiology Office Note   Date:  10/22/2015   ID:  Dwayne Harper, DOB January 29, 1946, MRN [id number]  PCP:  Irven Shelling, MD  Cardiologist:  none Primary Electrophysiologist: Thompson Grayer, MD    Chief Complaint  Patient presents with  . Loss of Consciousness     History of Present Illness: Dwayne Harper is a 70 y.o. male who presents today for electrophysiology evaluation.   He reports being in good health and remains active.  He was packing his car to go to the beach on 09/26/2015 when he has abrupt syncope with no warning.  He was under stress for several weeks prior, doing budget for work.  He had very little sleep for several days prior.  He had EEG which was normal.  He does not think that he was dehydrated.  He denies any symptoms before or after the event.  He [facility] to [facility] with scalp hematoma.   He was observed on telemetry without arrhythmias.  Echo was low risk.  EKG was normal. He was found to have LLE DVT.  CT did not reveal PTE.  He has been to [place] and had prolonged travel in May which was felt to possibly be the cause.  He has been placed on eliquis x 6 months. He has mild SOB chronically with ambulating up stairs.  Today, he denies symptoms of palpitations, chest pain, shortness of breath at rest, orthopnea, PND, lower extremity edema, claudication, dizziness, presyncope, bleeding, or neurologic sequela. The patient is tolerating medications without difficulties and is otherwise without complaint today.    Past Medical History:  Diagnosis Date  . Barrett esophagus   . BPH (benign prostatic hyperplasia)   . DVT of lower extremity (deep venous thrombosis) ([place]) 09/2015  . ED (erectile dysfunction)   . GERD (gastroesophageal reflux disease)   . Gout   . Hiatal hernia    endoscopy   . Scalp hematoma   . Syncope 09/26/2015  . Testicular cancer [facility])    orchiectomy  . Tubular adenoma    Past Surgical History:  Procedure Laterality Date  .  COLONOSCOPY  10/2013   tubular adenoma  . TESTICLE REMOVAL  1997   cancer  . TONSILLECTOMY    . WISDOM TOOTH EXTRACTION       Current Outpatient Prescriptions  Medication Sig Dispense Refill  . apixaban (ELIQUIS) 5 MG TABS tablet Take 5 mg by mouth 2 (two) times daily.    . Ascorbic Acid (VITAMIN C PO) Take 1 tablet by mouth daily.    . Coenzyme Q10 (CO Q 10 PO) Take 1 tablet by mouth daily.    . Cyanocobalamin (VITAMIN B-12 PO) Take 1 tablet by mouth daily.    Marland Kitchen glucosamine-chondroitin 500-400 MG tablet Take 1 tablet by mouth daily.     . Multiple Vitamin (MULTIVITAMIN) tablet Take 1 tablet by mouth daily.    . Omega-3 Fatty Acids (FISH OIL) 1000 MG CAPS Take 1 capsule by mouth daily.     Marland Kitchen omeprazole (PRILOSEC) 20 MG capsule Take 20 mg by mouth daily.     No current facility-administered medications for this visit.     Allergies:   Lactose intolerance (gi); Peanut-containing drug products; and Penicillins   Social History:  The patient  reports that he has never smoked. He has never used smokeless tobacco. He reports that he drinks alcohol. He reports that he does not use drugs.   Family History:  The patient's  family history includes Aneurysm in his  father; Atrial fibrillation in his mother; CAD (age of onset: 22) in his mother; Coronary artery disease in his father; Deep vein thrombosis in his daughter; Deep vein thrombosis (age of onset: 76) in his father; Dementia in his mother; Heart attack in his mother; Heart disease in his mother; Hypertension in his father and mother; Prostate cancer in his father.   Mother had CHF but lived to age 49.  No siblings.     ROS:  Please see the history of present illness.   All other systems are reviewed and negative.    PHYSICAL EXAM: VS:  BP 124/80   Pulse 70   Ht 5' 8.5" (1.74 m)   Wt 186 lb (84.4 kg)   BMI 27.87 kg/m  , BMI Body mass index is 27.87 kg/m. GEN: Well nourished, well developed, in no acute distress  HEENT: normal    Neck: no JVD, carotid bruits, or masses Cardiac: RRR; no murmurs, rubs, or gallops,no edema  Respiratory:  clear to auscultation bilaterally, normal work of breathing GI: soft, nontender, nondistended, + BS MS: no deformity or atrophy  Skin: warm and dry  Neuro:  Strength and sensation are intact Psych: euthymic mood, full affect Carotid massage maneuver normal today  EKG:  EKG is not ordered today. The ekg 09/26/15 revealed sinus rhythm 70 bpm, Qtc 428 msec   Recent Labs: 09/26/2015: ALT 27 09/28/2015: BUN 14; Creatinine, Ser 0.90; Potassium 4.6; Sodium 138 09/29/2015: Hemoglobin 13.5; Platelets 122    Lipid Panel     Component Value Date/Time   CHOL 189 04/20/2007 0807   TRIG 31 04/20/2007 0807   HDL 60.8 04/20/2007 0807   CHOLHDL 3.1 CALC 04/20/2007 0807   VLDL 6 04/20/2007 0807   LDLCALC 122 (H) 04/20/2007 0807     Wt Readings from Last 3 Encounters:  10/22/15 186 lb (84.4 kg)  09/29/15 183 lb 6.4 oz (83.2 kg)  01/09/09 196 lb 12 oz (89.2 kg)      Other studies Reviewed: Additional studies/ records that were reviewed today include: records from recent hospitalization, [facility] records  Review of the above records today demonstrates: as above   ASSESSMENT AND PLAN:  1.  Syncope Unclear etiology ekg and echo are benign No cardiac symptoms No FH of arrhythmias I have reviewed his monitor which he continues to wear.  No arrhythmias thus far. ETT to evaluate for exercise induced arrhythmias and to evaluate SOB with exertion.  If low risk, no further workup planned  Follow-up as needed No driving x 6 months (pt aware)  2. DVT On eliquis x 6 months PCP to follow  Current medicines are reviewed at length with the patient today.   The patient does not have concerns regarding his medicines.  The following changes were made today:  none  Labs/ tests ordered today include:  No orders of the defined types were placed in this encounter.    Army Fossa,  MD  10/22/2015 11:07 AM     [facility] [address] [place] [address] [phone] (office) [phone] (fax)

## 2015-10-22 NOTE — Patient Instructions (Addendum)
Medication Instructions:  Your physician recommends that you continue on your current medications as directed. Please refer to the Current Medication list given to you today.   Labwork: None ordered   Testing/Procedures: Your physician has requested that you have an exercise tolerance test. For further information please visit [email or web address]. Please also follow instruction sheet, as given.    Follow-Up: Your physician wants you to follow-up: AS NEEDED with Dr. Rayann Heman.    Any Other Special Instructions Will Be Listed Below (If Applicable).  No driving for 6 months (Starting 09/26/15 - Ending 03/29/16)   If you need a refill on your cardiac medications before your next appointment, please call your pharmacy.

## 2015-10-23 ENCOUNTER — Encounter: Payer: Self-pay | Admitting: Internal Medicine

## 2015-10-24 ENCOUNTER — Ambulatory Visit (INDEPENDENT_AMBULATORY_CARE_PROVIDER_SITE_OTHER): Payer: PRIVATE HEALTH INSURANCE

## 2015-10-24 ENCOUNTER — Encounter: Payer: Self-pay | Admitting: Internal Medicine

## 2015-10-24 DIAGNOSIS — R55 Syncope and collapse: Secondary | ICD-10-CM | POA: Diagnosis not present

## 2015-10-24 DIAGNOSIS — R0602 Shortness of breath: Secondary | ICD-10-CM | POA: Diagnosis not present

## 2015-10-24 LAB — EXERCISE TOLERANCE TEST
CSEPED: 4 min
CSEPHR: 101 %
CSEPPHR: 155 {beats}/min
Estimated workload: 6.6 METS
Exercise duration (sec): 43 s
MPHR: 153 {beats}/min
RPE: 16
Rest HR: 76 {beats}/min

## 2015-11-04 ENCOUNTER — Telehealth: Payer: Self-pay | Admitting: Internal Medicine

## 2015-11-04 HISTORY — PX: OTHER SURGICAL HISTORY: SHX169

## 2015-11-04 NOTE — Telephone Encounter (Signed)
Spoke with wife and let her know he will need to come in tomorrow to discuss the ETT.   He mailed his 30 day monitor back on Sat per wife.  She is hoping to get the results of this also.  I have left a message for Shelly in monitors that he is coming tomorrow.

## 2015-11-04 NOTE — Telephone Encounter (Signed)
New message       Pt wife is requesting that some one calls her or the pt about test results. Please call.

## 2015-11-05 ENCOUNTER — Ambulatory Visit (INDEPENDENT_AMBULATORY_CARE_PROVIDER_SITE_OTHER): Payer: PRIVATE HEALTH INSURANCE | Admitting: Physician Assistant

## 2015-11-05 ENCOUNTER — Encounter: Payer: Self-pay | Admitting: Physician Assistant

## 2015-11-05 VITALS — BP 108/70 | HR 70 | Ht 68.0 in | Wt 188.0 lb

## 2015-11-05 DIAGNOSIS — D696 Thrombocytopenia, unspecified: Secondary | ICD-10-CM

## 2015-11-05 DIAGNOSIS — Z86718 Personal history of other venous thrombosis and embolism: Secondary | ICD-10-CM | POA: Insufficient documentation

## 2015-11-05 DIAGNOSIS — R55 Syncope and collapse: Secondary | ICD-10-CM

## 2015-11-05 DIAGNOSIS — R9439 Abnormal result of other cardiovascular function study: Secondary | ICD-10-CM

## 2015-11-05 DIAGNOSIS — Z01810 Encounter for preprocedural cardiovascular examination: Secondary | ICD-10-CM

## 2015-11-05 LAB — CBC WITH DIFFERENTIAL/PLATELET
BASOS PCT: 1 %
Basophils Absolute: 58 cells/uL (ref 0–200)
Eosinophils Absolute: 348 cells/uL (ref 15–500)
Eosinophils Relative: 6 %
HEMATOCRIT: 42.2 % (ref 38.5–50.0)
HEMOGLOBIN: 14.6 g/dL (ref 13.2–17.1)
LYMPHS ABS: 1624 {cells}/uL (ref 850–3900)
LYMPHS PCT: 28 %
MCH: 31.1 pg (ref 27.0–33.0)
MCHC: 34.6 g/dL (ref 32.0–36.0)
MCV: 90 fL (ref 80.0–100.0)
MONO ABS: 812 {cells}/uL (ref 200–950)
MPV: 10.1 fL (ref 7.5–12.5)
Monocytes Relative: 14 %
Neutro Abs: 2958 cells/uL (ref 1500–7800)
Neutrophils Relative %: 51 %
Platelets: 202 10*3/uL (ref 140–400)
RBC: 4.69 MIL/uL (ref 4.20–5.80)
RDW: 13.1 % (ref 11.0–15.0)
WBC: 5.8 10*3/uL (ref 3.8–10.8)

## 2015-11-05 NOTE — Progress Notes (Signed)
Cardiology Office Note    Date:  11/05/2015  ID:  [name], DOB February 27, 1946, MRN [id number] PCP:  Irven Shelling, MD  Cardiologist:  Allred  Chief Complaint: abnormal stress test  History of Present Illness:  Dwayne Harper is a 70 y.o. male with history of BPH, Barrett's esophagus, ED, GERD, hiatal hernia, remote testicular cancer s/p orchiectomy, recent syncope and DVT who presents to discuss abnormal stress test. He was admitted 09/2015 with abrupt syncope - under work stress for several weeks prior with very little sleep leading up. EEG normal. Tele without arrhythmias. 2D Echo 09/27/15: EF 60-65%, grossly normal diastolic function, trivial TR, normal PASP. MR brain normal. He was found to have LLE DVT 09/28/15; CT without PE but did show coronary calcificaiton - prolonged travel in May felt to be the cause, recommended for Eliquis x 6 months. Dr. Rayann Heman felt syncope was of unclear etiology, recommended no driving x 6 months. He had reviewed the patient's monitor wihtout significant arrhythmias thus far. He recommended ETT which was done 10/24/15 showing significant ST depression in stage II and early recovery worrisome for ischemia. He was asked to follow up in clnic today to discuss cath. Recent labs otherwise notable for mild thrombocytopenia plt 122-131, Hgb wnl, Cr 0.90, UDS neg.  Dr. Rayann Heman recommended he come in today to discuss cath. The patient is surprised to hear his stress test was negative. Has family history of CAD (mom had severe MI) but this is the first heart issue he's ever had. Denies any CP, SOB, recurrent syncope or palpitations - but has kept activity limited ever since DVT.   Past Medical History:  Diagnosis Date  . Barrett esophagus   . BPH (benign prostatic hyperplasia)   . DVT of lower extremity (deep venous thrombosis) ([place]) 09/2015  . ED (erectile dysfunction)   . GERD (gastroesophageal reflux disease)   . Gout   . Hiatal hernia    endoscopy   .  Scalp hematoma   . Syncope 09/26/2015  . Testicular cancer [facility])    orchiectomy  . Tubular adenoma     Past Surgical History:  Procedure Laterality Date  . COLONOSCOPY  10/2013   tubular adenoma  . TESTICLE REMOVAL  1997   cancer  . TONSILLECTOMY    . WISDOM TOOTH EXTRACTION      Current Medications: Current Outpatient Prescriptions  Medication Sig Dispense Refill  . apixaban (ELIQUIS) 5 MG TABS tablet Take 5 mg by mouth 2 (two) times daily.    . Ascorbic Acid (VITAMIN C PO) Take 1 tablet by mouth daily.    . Coenzyme Q10 (CO Q 10 PO) Take 1 tablet by mouth daily.    . Cyanocobalamin (VITAMIN B-12 PO) Take 1 tablet by mouth daily.    Marland Kitchen glucosamine-chondroitin 500-400 MG tablet Take 1 tablet by mouth daily.     . Multiple Vitamin (MULTIVITAMIN) tablet Take 1 tablet by mouth daily.    . Omega-3 Fatty Acids (FISH OIL) 1000 MG CAPS Take 1 capsule by mouth daily.     Marland Kitchen omeprazole (PRILOSEC) 20 MG capsule Take 20 mg by mouth daily.    . tamsulosin (FLOMAX) 0.4 MG CAPS capsule Take 0.4 mg by mouth daily.     No current facility-administered medications for this visit.      Allergies:   Lactose intolerance (gi); Peanut-containing drug products; and Penicillins   Social History   Social History  . Marital status: Married    Spouse name: N/A  .  Number of children: N/A  . Years of education: N/A   Social History Main Topics  . Smoking status: Never Smoker  . Smokeless tobacco: Never Used  . Alcohol use Yes     Comment: 2 drinks per week  . Drug use: No  . Sexual activity: Not Asked   Other Topics Concern  . None   Social History Narrative   Pt lives in [place] with spouse.  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.  Wife is endowed chair at [employer] in [employer].     Family History:  The patient's family history includes Aneurysm in his father; Atrial fibrillation in his mother; CAD (age of onset: 49) in his mother; Coronary artery disease in his  father; Deep vein thrombosis in his daughter; Deep vein thrombosis (age of onset: 2) in his father; Dementia in his mother; Heart attack in his mother; Heart disease in his mother; Hypertension in his father and mother; Prostate cancer in his father.   ROS:   Please see the history of present illness.  All other systems are reviewed and otherwise negative.    PHYSICAL EXAM:   VS:  BP 108/70   Pulse 70   Ht 5\' 8"  (1.727 m)   Wt 188 lb (85.3 kg)   BMI 28.59 kg/m   BMI: Body mass index is 28.59 kg/m. GEN: Well nourished, well developed WM, in no acute distress  HEENT: normocephalic, atraumatic Neck: no JVD, carotid bruits, or masses Cardiac: RRR; no murmurs, rubs, or gallops, no edema  Respiratory:  clear to auscultation bilaterally, normal work of breathing GI: soft, nontender, nondistended, + BS MS: no deformity or atrophy  Skin: warm and dry, no rash Neuro:  Alert and Oriented x 3, Strength and sensation are intact, follows commands Psych: euthymic mood, full affect  Wt Readings from Last 3 Encounters:  11/05/15 188 lb (85.3 kg)  10/22/15 186 lb (84.4 kg)  09/29/15 183 lb 6.4 oz (83.2 kg)      Studies/Labs Reviewed:   EKG:  EKG was ordered today and personally reviewed by me and demonstrates NSR 80bpm no acute changes, nonspeciifc ST scooping III, Qtc 433ms  Recent Labs: 09/26/2015: ALT 27 09/28/2015: BUN 14; Creatinine, Ser 0.90; Potassium 4.6; Sodium 138 09/29/2015: Hemoglobin 13.5; Platelets 122   Lipid Panel    Component Value Date/Time   CHOL 189 04/20/2007 0807   TRIG 31 04/20/2007 0807   HDL 60.8 04/20/2007 0807   CHOLHDL 3.1 CALC 04/20/2007 0807   VLDL 6 04/20/2007 0807   LDLCALC 122 (H) 04/20/2007 0807    Additional studies/ records that were reviewed today include: Summarized above.    ASSESSMENT & PLAN:   1. Abnormal stress test - I reviewed this with Dr. Rayann Heman given his recent DVT who does feel cath is indicated in light of syncope. I also  reviewed with Dr. Angelena Form - we are nervous about the idea of just stopping his Eliquis and skipping a dose given his recent DVT, thus have recommended he be admitted the night before for heparin bridge. Per d/w Dr. Angelena Form, he can take his Eliquis tomorrow AM and plan heparin per pharmacy upon arrival to the hospital to begin tomorrow night. Check pre-cath labs today. Cath scheduled Friday AM with Dr. Angelena Form. Hospital orders will need to be written including pre-cath orders on patient's arrival by our hospital team within the inpatient encounter. Risks and benefits of cardiac catheterization have been discussed with the patient. These  include bleeding, infection, kidney damage, stroke, heart attack, death.  The patient understands these risks and is willing to proceed. If CAD is present, will need to start scheduled aspirin - would recommend order for lipids & statin initiation the morning after cath. 2. History of DVT - see above re: Eliquis. Per prior notes, this is planned for 6 months. 3. Syncope - another reason why cath is indicated. 4. Thrombocytopenia - f/u CBC today.  Disposition: F/u with me 2 weeks after cath.   Medication Adjustments/Labs and Tests Ordered: Current medicines are reviewed at length with the patient today.  Concerns regarding medicines are outlined above. Medication changes, Labs and Tests ordered today are summarized above and listed in the Patient Instructions accessible in Encounters.   Raechel Ache PA-C  11/05/2015 2:35 PM    [facility] [address], [place], [place]  [address] Phone: [phone]; Fax: [phone]

## 2015-11-05 NOTE — Patient Instructions (Addendum)
**Note De-Identified  Obfuscation** Medication Instructions:  Same-no changes  Labwork: Today-CMET, CBCD and INR  Testing/Procedures: Your physician has requested that you have a cardiac catheterization. Cardiac catheterization is used to diagnose and/or treat various heart conditions. Doctors may recommend this procedure for a number of different reasons. The most common reason is to evaluate chest pain. Chest pain can be a symptom of coronary artery disease (CAD), and cardiac catheterization can show whether plaque is narrowing or blocking your heart's arteries. This procedure is also used to evaluate the valves, as well as measure the blood flow and oxygen levels in different parts of your heart. For further information please visit [email or web address]. Please follow instruction sheet, as given.   Follow-Up: In 2 weeks post Cath with Dayne Dunn PA-c  Any Other Special Instructions Will Be Listed Below (If Applicable). Someone from Bed Control at the hospital will be contacting you with information concerning your admission to the hospital tomorrow afternoon.     If you need a refill on your cardiac medications before your next appointment, please call your pharmacy.

## 2015-11-06 ENCOUNTER — Observation Stay (HOSPITAL_COMMUNITY)
Admission: RE | Admit: 2015-11-06 | Discharge: 2015-11-08 | Disposition: A | Discharge status: Home / Self Care | Payer: PRIVATE HEALTH INSURANCE | Source: Ambulatory Visit | Attending: Internal Medicine | Admitting: Internal Medicine

## 2015-11-06 ENCOUNTER — Encounter (HOSPITAL_COMMUNITY): Payer: Self-pay | Admitting: General Practice

## 2015-11-06 DIAGNOSIS — Z9079 Acquired absence of other genital organ(s): Secondary | ICD-10-CM | POA: Diagnosis not present

## 2015-11-06 DIAGNOSIS — I2510 Atherosclerotic heart disease of native coronary artery without angina pectoris: Principal | ICD-10-CM | POA: Insufficient documentation

## 2015-11-06 DIAGNOSIS — Z86718 Personal history of other venous thrombosis and embolism: Secondary | ICD-10-CM | POA: Diagnosis not present

## 2015-11-06 DIAGNOSIS — Z8547 Personal history of malignant neoplasm of testis: Secondary | ICD-10-CM | POA: Diagnosis not present

## 2015-11-06 DIAGNOSIS — D696 Thrombocytopenia, unspecified: Secondary | ICD-10-CM | POA: Diagnosis not present

## 2015-11-06 DIAGNOSIS — R55 Syncope and collapse: Secondary | ICD-10-CM | POA: Diagnosis not present

## 2015-11-06 DIAGNOSIS — K219 Gastro-esophageal reflux disease without esophagitis: Secondary | ICD-10-CM | POA: Diagnosis not present

## 2015-11-06 DIAGNOSIS — Z8249 Family history of ischemic heart disease and other diseases of the circulatory system: Secondary | ICD-10-CM | POA: Diagnosis not present

## 2015-11-06 DIAGNOSIS — Z880 Allergy status to penicillin: Secondary | ICD-10-CM | POA: Insufficient documentation

## 2015-11-06 DIAGNOSIS — R9439 Abnormal result of other cardiovascular function study: Secondary | ICD-10-CM

## 2015-11-06 DIAGNOSIS — E785 Hyperlipidemia, unspecified: Secondary | ICD-10-CM | POA: Diagnosis not present

## 2015-11-06 DIAGNOSIS — Z7901 Long term (current) use of anticoagulants: Secondary | ICD-10-CM | POA: Diagnosis not present

## 2015-11-06 DIAGNOSIS — N400 Enlarged prostate without lower urinary tract symptoms: Secondary | ICD-10-CM | POA: Diagnosis not present

## 2015-11-06 HISTORY — DX: Personal history of other diseases of the musculoskeletal system and connective tissue: Z87.39

## 2015-11-06 HISTORY — DX: Pneumonia, unspecified organism: J18.9

## 2015-11-06 HISTORY — DX: Unspecified asthma, uncomplicated: J45.909

## 2015-11-06 HISTORY — DX: Atherosclerotic heart disease of native coronary artery without angina pectoris: I25.10

## 2015-11-06 HISTORY — DX: Acute embolism and thrombosis of unspecified deep veins of unspecified lower extremity: I82.409

## 2015-11-06 HISTORY — DX: Hyperlipidemia, unspecified: E78.5

## 2015-11-06 HISTORY — DX: Abnormal result of other cardiovascular function study: R94.39

## 2015-11-06 LAB — TROPONIN I: Troponin I: <0.03 ng/mL (ref <0.03)

## 2015-11-06 LAB — COMPREHENSIVE METABOLIC PANEL
ALBUMIN: 3.8 g/dL (ref 3.6–5.1)
ALT: 18 U/L (ref 9–46)
AST: 21 U/L (ref 10–35)
Alkaline Phosphatase: 51 U/L (ref 40–115)
BUN: 18 mg/dL (ref 7–25)
CHLORIDE: 102 mmol/L (ref 98–110)
CO2: 28 mmol/L (ref 20–31)
Calcium: 8.9 mg/dL (ref 8.6–10.3)
Creat: 1 mg/dL (ref 0.70–1.25)
Glucose, Bld: 89 mg/dL (ref 65–99)
POTASSIUM: 4.4 mmol/L (ref 3.5–5.3)
Sodium: 138 mmol/L (ref 135–146)
TOTAL PROTEIN: 6.6 g/dL (ref 6.1–8.1)
Total Bilirubin: 0.3 mg/dL (ref 0.2–1.2)

## 2015-11-06 LAB — PROTIME-INR
INR: 1.1
INR: 1.08
PROTHROMBIN TIME: 11.4 s (ref 9.0–11.5)
Prothrombin Time: 14.1 s (ref 11.4–15.2)

## 2015-11-06 LAB — HEPARIN LEVEL (UNFRACTIONATED): Heparin Unfractionated: 0.61 IU/mL (ref 0.30–0.70)

## 2015-11-06 LAB — APTT: aPTT: 34 s (ref 24–36)

## 2015-11-06 MED ORDER — SODIUM CHLORIDE 0.9% FLUSH
3.0000 mL | Freq: Two times a day (BID) | INTRAVENOUS | Status: DC
  Administered 2015-11-06 – 2015-11-08 (×4): 3 mL via INTRAVENOUS

## 2015-11-06 MED ORDER — HEPARIN (PORCINE) IN NACL 100-0.45 UNIT/ML-% IJ SOLN
1000.0000 [IU]/h | INTRAMUSCULAR | Status: DC
  Administered 2015-11-06: 1100 [IU]/h via INTRAVENOUS
  Filled 2015-11-06: qty 250

## 2015-11-06 MED ORDER — SODIUM CHLORIDE 0.9 % IV SOLN: 250.0000 mL | INTRAVENOUS | Status: DC | PRN

## 2015-11-06 MED ORDER — TAMSULOSIN HCL 0.4 MG PO CAPS
0.4000 mg | ORAL_CAPSULE | Freq: Every day | ORAL | Status: DC
  Administered 2015-11-06 – 2015-11-07 (×2): 0.4 mg via ORAL
  Filled 2015-11-06 (×2): qty 1

## 2015-11-06 MED ORDER — ASPIRIN 81 MG PO CHEW
324.0000 mg | CHEWABLE_TABLET | ORAL | Status: AC
  Administered 2015-11-06: 324 mg via ORAL
  Filled 2015-11-06: qty 4

## 2015-11-06 MED ORDER — SODIUM CHLORIDE 0.9% FLUSH: 3.0000 mL | INTRAVENOUS | Status: DC | PRN

## 2015-11-06 MED ORDER — ASPIRIN EC 81 MG PO TBEC
81.0000 mg | DELAYED_RELEASE_TABLET | Freq: Every day | ORAL | Status: DC
  Administered 2015-11-08: 81 mg via ORAL
  Filled 2015-11-06: qty 1

## 2015-11-06 MED ORDER — NITROGLYCERIN 0.4 MG SL SUBL: 0.4000 mg | SUBLINGUAL_TABLET | SUBLINGUAL | Status: DC | PRN

## 2015-11-06 MED ORDER — PANTOPRAZOLE SODIUM 40 MG PO TBEC
40.0000 mg | DELAYED_RELEASE_TABLET | Freq: Every day | ORAL | Status: DC
  Administered 2015-11-06 – 2015-11-08 (×2): 40 mg via ORAL
  Filled 2015-11-06 (×2): qty 1

## 2015-11-06 MED ORDER — ACETAMINOPHEN 325 MG PO TABS: 650.0000 mg | ORAL_TABLET | ORAL | Status: DC | PRN

## 2015-11-06 MED ORDER — ASPIRIN 300 MG RE SUPP
300.0000 mg | RECTAL | Status: AC
  Filled 2015-11-06: qty 1

## 2015-11-06 MED ORDER — ONDANSETRON HCL 4 MG/2ML IJ SOLN: 4.0000 mg | Freq: Four times a day (QID) | INTRAMUSCULAR | Status: DC | PRN

## 2015-11-06 NOTE — H&P (Signed)
Cardiology Office Note    Date:  11/05/2015  ID:  Dwayne Harper, DOB 14-Mar-1945, MRN [id number] PCP:  Irven Shelling, MD            Cardiologist:  Allred  Chief Complaint: abnormal stress test  History of Present Illness:  Dwayne Harper is a 70 y.o. male with history of BPH, Barrett's esophagus, ED, GERD, hiatal hernia, remote testicular cancer s/p orchiectomy, recent syncope and DVT who presents to discuss abnormal stress test. He was admitted 09/2015 with abrupt syncope - under work stress for several weeks prior with very little sleep leading up. EEG normal. Tele without arrhythmias. 2D Echo 09/27/15: EF 60-65%, grossly normal diastolic function, trivial TR, normal PASP. MR brain normal. He was found to have LLE DVT 09/28/15; CT without PE but did show coronary calcificaiton - prolonged travel in May felt to be the cause, recommended for Eliquis x 6 months. Dr. Rayann Heman felt syncope was of unclear etiology, recommended no driving x 6 months. He had reviewed the patient's monitor wihtout significant arrhythmias thus far. He recommended ETT which was done 10/24/15 showing significant ST depression in stage II and early recovery worrisome for ischemia. He was asked to follow up in clnic today to discuss cath. Recent labs otherwise notable for mild thrombocytopenia plt 122-131, Hgb wnl, Cr 0.90, UDS neg.  Dr. Rayann Heman recommended he come in today to discuss cath. The patient is surprised to hear his stress test was negative. Has family history of CAD (mom had severe MI) but this is the first heart issue he's ever had. Denies any CP, SOB, recurrent syncope or palpitations - but has kept activity limited ever since DVT.       Past Medical History:  Diagnosis Date  . Barrett esophagus   . BPH (benign prostatic hyperplasia)   . DVT of lower extremity (deep venous thrombosis) ([place]) 09/2015  . ED (erectile dysfunction)   . GERD (gastroesophageal reflux disease)   . Gout   . Hiatal  hernia    endoscopy   . Scalp hematoma   . Syncope 09/26/2015  . Testicular cancer [facility])    orchiectomy  . Tubular adenoma          Past Surgical History:  Procedure Laterality Date  . COLONOSCOPY  10/2013   tubular adenoma  . TESTICLE REMOVAL  1997   cancer  . TONSILLECTOMY    . WISDOM TOOTH EXTRACTION      Current Medications:       Current Outpatient Prescriptions  Medication Sig Dispense Refill  . apixaban (ELIQUIS) 5 MG TABS tablet Take 5 mg by mouth 2 (two) times daily.    . Ascorbic Acid (VITAMIN C PO) Take 1 tablet by mouth daily.    . Coenzyme Q10 (CO Q 10 PO) Take 1 tablet by mouth daily.    . Cyanocobalamin (VITAMIN B-12 PO) Take 1 tablet by mouth daily.    Marland Kitchen glucosamine-chondroitin 500-400 MG tablet Take 1 tablet by mouth daily.     . Multiple Vitamin (MULTIVITAMIN) tablet Take 1 tablet by mouth daily.    . Omega-3 Fatty Acids (FISH OIL) 1000 MG CAPS Take 1 capsule by mouth daily.     Marland Kitchen omeprazole (PRILOSEC) 20 MG capsule Take 20 mg by mouth daily.    . tamsulosin (FLOMAX) 0.4 MG CAPS capsule Take 0.4 mg by mouth daily.     No current facility-administered medications for this visit.      Allergies:   Lactose intolerance (  gi); Peanut-containing drug products; and Penicillins   Social History        Social History  . Marital status: Married    Spouse name: N/A  . Number of children: N/A  . Years of education: N/A         Social History Main Topics  . Smoking status: Never Smoker  . Smokeless tobacco: Never Used  . Alcohol use Yes     Comment: 2 drinks per week  . Drug use: No  . Sexual activity: Not Asked       Other Topics Concern  . None      Social History Narrative   Pt lives in [place] with spouse.  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.  Wife is endowed chair at [employer] in [employer].     Family History:  The patient's family history includes Aneurysm in his  father; Atrial fibrillation in his mother; CAD (age of onset: 10) in his mother; Coronary artery disease in his father; Deep vein thrombosis in his daughter; Deep vein thrombosis (age of onset: 26) in his father; Dementia in his mother; Heart attack in his mother; Heart disease in his mother; Hypertension in his father and mother; Prostate cancer in his father.   ROS:   Please see the history of present illness.  All other systems are reviewed and otherwise negative.    PHYSICAL EXAM:   VS:  BP 108/70   Pulse 70   Ht 5\' 8"  (1.727 m)   Wt 188 lb (85.3 kg)   BMI 28.59 kg/m   BMI: Body mass index is 28.59 kg/m. GEN: Well nourished, well developed WM, in no acute distress  HEENT: normocephalic, atraumatic Neck: no JVD, carotid bruits, or masses Cardiac: RRR; no murmurs, rubs, or gallops, no edema  Respiratory:  clear to auscultation bilaterally, normal work of breathing GI: soft, nontender, nondistended, + BS MS: no deformity or atrophy  Skin: warm and dry, no rash Neuro:  Alert and Oriented x 3, Strength and sensation are intact, follows commands Psych: euthymic mood, full affect     Wt Readings from Last 3 Encounters:  11/05/15 188 lb (85.3 kg)  10/22/15 186 lb (84.4 kg)  09/29/15 183 lb 6.4 oz (83.2 kg)      Studies/Labs Reviewed:   EKG:  EKG was ordered today and personally reviewed by me and demonstrates NSR 80bpm no acute changes, nonspeciifc ST scooping III, Qtc 462ms  Recent Labs: 09/26/2015: ALT 27 09/28/2015: BUN 14; Creatinine, Ser 0.90; Potassium 4.6; Sodium 138 09/29/2015: Hemoglobin 13.5; Platelets 122   Lipid Panel Labs (Brief)          Component Value Date/Time   CHOL 189 04/20/2007 0807   TRIG 31 04/20/2007 0807   HDL 60.8 04/20/2007 0807   CHOLHDL 3.1 CALC 04/20/2007 0807   VLDL 6 04/20/2007 0807   LDLCALC 122 (H) 04/20/2007 0807      Additional studies/ records that were reviewed today include: Summarized  above.    ASSESSMENT & PLAN:   1. Abnormal stress test - I reviewed this with Dr. Rayann Heman given his recent DVT who does feel cath is indicated in light of syncope. I also reviewed with Dr. Angelena Form - we are nervous about the idea of just stopping his Eliquis and skipping a dose given his recent DVT, thus have recommended he be admitted the night before for heparin bridge. Per d/w Dr. Angelena Form, he can take his Eliquis tomorrow AM and  plan heparin per pharmacy upon arrival to the hospital to begin tomorrow night. Check pre-cath labs today. Cath scheduled Friday AM with Dr. Angelena Form. Hospital orders will need to be written including pre-cath orders on patient's arrival by our hospital team within the inpatient encounter. Risks and benefits of cardiac catheterization have been discussed with the patient. These include bleeding, infection, kidney damage, stroke, heart attack, death.  The patient understands these risks and is willing to proceed. If CAD is present, will need to start scheduled aspirin - would recommend order for lipids & statin initiation the morning after cath. 2. History of DVT - see above re: Eliquis. Per prior notes, this is planned for 6 months. 3. Syncope - another reason why cath is indicated. 4. Thrombocytopenia - f/u CBC today.  Disposition: F/u with me 2 weeks after cath.   Medication Adjustments/Labs and Tests Ordered: Current medicines are reviewed at length with the patient today.  Concerns regarding medicines are outlined above. Medication changes, Labs and Tests ordered today are summarized above and listed in the Patient Instructions accessible in Encounters.   Raechel Ache PA-C  11/05/2015 2:35 PM    [facility] [address], [place], [place]  [address] Phone: [phone]; Fax: [phone]      ADDENDUM: He recently had an ETT with Significant ST depression in stage II and early recovery worrisome for ischemia. I have  ordered a heparin bridge due to recent DVT and stopping Eliquis for cath. I will place admission orders and cath orders.

## 2015-11-06 NOTE — Progress Notes (Signed)
ANTICOAGULATION CONSULT NOTE - Initial Consult  Pharmacy Consult for heparin Indication: chest pain/ACS  Allergies  Allergen Reactions  . Lactose Intolerance (Gi)     unknown  . Peanut-Containing Drug Products Nausea And Vomiting  . Penicillins Other (See Comments)    Unknown reaction in childhood Has patient had a PCN reaction causing immediate rash, facial/tongue/throat swelling, SOB or lightheadedness with hypotension: unknown Has patient had a PCN reaction causing severe rash involving mucus membranes or skin necrosis: unknown Has patient had a PCN reaction that required hospitalization: unknown Has patient had a PCN reaction occurring within the last 10 years: No If all of the above answers are "NO", then may proceed with Cephalosporin use.      Patient Measurements:   Heparin Dosing Weight: 85.3 kg  Vital Signs: Temp: 98.2 F (36.8 C) (08/31 1923) Temp Source: Oral (08/31 1923) BP: 114/76 (08/31 1923) Pulse Rate: 67 (08/31 1923)  Labs:  Recent Labs  11/05/15 1546  HGB 14.6  HCT 42.2  PLT 202  LABPROT 11.4  INR 1.1  CREATININE 1.00    Estimated Creatinine Clearance: 75.2 mL/min (by C-G formula based on SCr of 1 mg/dL).   Medical History: Past Medical History:  Diagnosis Date  . Barrett esophagus   . BPH (benign prostatic hyperplasia)   . DVT of lower extremity (deep venous thrombosis) ([place]) 09/2015  . ED (erectile dysfunction)   . GERD (gastroesophageal reflux disease)   . Gout   . Hiatal hernia    endoscopy   . Scalp hematoma   . Syncope 09/26/2015  . Testicular cancer [facility])    orchiectomy  . Tubular adenoma     Assessment: 70yoM on Eliquis PTA for DVT (plan for 6 months). Per patient the last apixaban dose was taken at 1000 today. Patient had an abnormal stress test 8/30, recent DVT, and syncope, has been admitted for heparin bridge for cath on Friday. Pharmacy consulted to start heparin tonight for ACS.   Goal of Therapy:  Heparin level  0.3-0.7 units/ml aPTT 66-102 seconds Monitor platelets by anticoagulation protocol: Yes   Plan:  Obtain baseline aPTT and anti-xa levels Plan to start heparin infusion at 2200 tonight, at 1100 units/hr Check aPTT and anti-Xa level in 6 hours and daily while on heparin Continue to monitor H&H and platelets   Thank you for allowing [place] to participate in this patients care. Jens Som, PharmD Pager: [phone] 11/06/2015,8:04 PM

## 2015-11-06 NOTE — Progress Notes (Signed)
Patient sitting up in bed, no needs at this time. Call light within reach. Tele verification called in.

## 2015-11-07 ENCOUNTER — Encounter (HOSPITAL_COMMUNITY)
Admission: RE | Disposition: A | Discharge status: Home / Self Care | Payer: Self-pay | Source: Ambulatory Visit | Attending: Internal Medicine

## 2015-11-07 DIAGNOSIS — R931 Abnormal findings on diagnostic imaging of heart and coronary circulation: Secondary | ICD-10-CM

## 2015-11-07 DIAGNOSIS — Z86718 Personal history of other venous thrombosis and embolism: Secondary | ICD-10-CM

## 2015-11-07 DIAGNOSIS — I2510 Atherosclerotic heart disease of native coronary artery without angina pectoris: Secondary | ICD-10-CM | POA: Diagnosis not present

## 2015-11-07 HISTORY — PX: CARDIAC CATHETERIZATION: SHX172

## 2015-11-07 LAB — BASIC METABOLIC PANEL
ANION GAP: 5 (ref 5–15)
BUN: 17 mg/dL (ref 6–20)
CALCIUM: 8.3 mg/dL — AB (ref 8.9–10.3)
CO2: 25 mmol/L (ref 22–32)
Chloride: 108 mmol/L (ref 101–111)
Creatinine, Ser: 0.9 mg/dL (ref 0.61–1.24)
GFR calc Af Amer: >60 mL/min (ref >60)
GFR calc non Af Amer: >60 mL/min (ref >60)
GLUCOSE: 100 mg/dL — AB (ref 65–99)
Potassium: 3.9 mmol/L (ref 3.5–5.1)
Sodium: 138 mmol/L (ref 135–145)

## 2015-11-07 LAB — COMPREHENSIVE METABOLIC PANEL
ALBUMIN: 3.1 g/dL — AB (ref 3.5–5.0)
ALT: 20 U/L (ref 17–63)
AST: 21 U/L (ref 15–41)
Alkaline Phosphatase: 43 U/L (ref 38–126)
Anion gap: 7 (ref 5–15)
BILIRUBIN TOTAL: 0.5 mg/dL (ref 0.3–1.2)
BUN: 20 mg/dL (ref 6–20)
CO2: 25 mmol/L (ref 22–32)
Calcium: 8.4 mg/dL — ABNORMAL LOW (ref 8.9–10.3)
Chloride: 107 mmol/L (ref 101–111)
Creatinine, Ser: 0.96 mg/dL (ref 0.61–1.24)
GFR calc Af Amer: >60 mL/min (ref >60)
GFR calc non Af Amer: >60 mL/min (ref >60)
GLUCOSE: 98 mg/dL (ref 65–99)
POTASSIUM: 4 mmol/L (ref 3.5–5.1)
Sodium: 139 mmol/L (ref 135–145)
TOTAL PROTEIN: 5.6 g/dL — AB (ref 6.5–8.1)

## 2015-11-07 LAB — LIPID PANEL
Cholesterol: 204 mg/dL — ABNORMAL HIGH (ref 0–200)
HDL: 59 mg/dL (ref >40)
LDL CALC: 139 mg/dL — AB (ref 0–99)
TRIGLYCERIDES: 30 mg/dL (ref <150)
Total CHOL/HDL Ratio: 3.5 RATIO
VLDL: 6 mg/dL (ref 0–40)

## 2015-11-07 LAB — CBC
HEMATOCRIT: 40.9 % (ref 39.0–52.0)
Hemoglobin: 13.8 g/dL (ref 13.0–17.0)
MCH: 30.9 pg (ref 26.0–34.0)
MCHC: 33.7 g/dL (ref 30.0–36.0)
MCV: 91.7 fL (ref 78.0–100.0)
Platelets: 169 10*3/uL (ref 150–400)
RBC: 4.46 MIL/uL (ref 4.22–5.81)
RDW: 12.4 % (ref 11.5–15.5)
WBC: 4.7 10*3/uL (ref 4.0–10.5)

## 2015-11-07 LAB — TROPONIN I
Troponin I: <0.03 ng/mL (ref <0.03)
Troponin I: <0.03 ng/mL (ref <0.03)

## 2015-11-07 LAB — PROTIME-INR
INR: 1.14
Prothrombin Time: 14.7 seconds (ref 11.4–15.2)

## 2015-11-07 LAB — HEPARIN LEVEL (UNFRACTIONATED): HEPARIN UNFRACTIONATED: 0.8 [IU]/mL — AB (ref 0.30–0.70)

## 2015-11-07 LAB — APTT: aPTT: 107 seconds — ABNORMAL HIGH (ref 24–36)

## 2015-11-07 SURGERY — LEFT HEART CATH AND CORONARY ANGIOGRAPHY
Anesthesia: LOCAL

## 2015-11-07 MED ORDER — HEPARIN (PORCINE) IN NACL 100-0.45 UNIT/ML-% IJ SOLN
1000.0000 [IU]/h | INTRAMUSCULAR | Status: DC
  Administered 2015-11-07: 1000 [IU]/h via INTRAVENOUS
  Filled 2015-11-07: qty 250

## 2015-11-07 MED ORDER — HEPARIN (PORCINE) IN NACL 2-0.9 UNIT/ML-% IJ SOLN
INTRAMUSCULAR | Status: DC | PRN
  Administered 2015-11-07: 1000 mL

## 2015-11-07 MED ORDER — SODIUM CHLORIDE 0.9 % WEIGHT BASED INFUSION
3.0000 mL/kg/h | INTRAVENOUS | Status: DC
  Administered 2015-11-07: 3 mL/kg/h via INTRAVENOUS

## 2015-11-07 MED ORDER — IOPAMIDOL (ISOVUE-370) INJECTION 76%
INTRAVENOUS | Status: AC
  Filled 2015-11-07: qty 100

## 2015-11-07 MED ORDER — FENTANYL CITRATE (PF) 100 MCG/2ML IJ SOLN
INTRAMUSCULAR | Status: AC
  Filled 2015-11-07: qty 2

## 2015-11-07 MED ORDER — MIDAZOLAM HCL 2 MG/2ML IJ SOLN
INTRAMUSCULAR | Status: AC
  Filled 2015-11-07: qty 2

## 2015-11-07 MED ORDER — SODIUM CHLORIDE 0.9% FLUSH
3.0000 mL | INTRAVENOUS | Status: DC | PRN
  Administered 2015-11-08: 3 mL via INTRAVENOUS
  Filled 2015-11-07: qty 3

## 2015-11-07 MED ORDER — HEPARIN (PORCINE) IN NACL 2-0.9 UNIT/ML-% IJ SOLN
INTRAMUSCULAR | Status: AC
  Filled 2015-11-07: qty 1000

## 2015-11-07 MED ORDER — IOPAMIDOL (ISOVUE-370) INJECTION 76%
INTRAVENOUS | Status: DC | PRN
  Administered 2015-11-07: 60 mL via INTRA_ARTERIAL

## 2015-11-07 MED ORDER — LIDOCAINE HCL (PF) 1 % IJ SOLN
INTRAMUSCULAR | Status: AC
  Filled 2015-11-07: qty 30

## 2015-11-07 MED ORDER — SODIUM CHLORIDE 0.9% FLUSH
3.0000 mL | Freq: Two times a day (BID) | INTRAVENOUS | Status: DC
  Administered 2015-11-07: 3 mL via INTRAVENOUS

## 2015-11-07 MED ORDER — VERAPAMIL HCL 2.5 MG/ML IV SOLN
INTRAVENOUS | Status: AC
  Filled 2015-11-07: qty 2

## 2015-11-07 MED ORDER — HEPARIN SODIUM (PORCINE) 1000 UNIT/ML IJ SOLN
INTRAMUSCULAR | Status: DC | PRN
  Administered 2015-11-07: 4000 [IU] via INTRAVENOUS

## 2015-11-07 MED ORDER — ASPIRIN 81 MG PO CHEW
81.0000 mg | CHEWABLE_TABLET | Freq: Every day | ORAL | Status: DC
  Administered 2015-11-08: 81 mg via ORAL
  Filled 2015-11-07: qty 1

## 2015-11-07 MED ORDER — ASPIRIN 81 MG PO CHEW
81.0000 mg | CHEWABLE_TABLET | ORAL | Status: AC
  Administered 2015-11-07: 81 mg via ORAL
  Filled 2015-11-07: qty 1

## 2015-11-07 MED ORDER — SODIUM CHLORIDE 0.9 % WEIGHT BASED INFUSION: 1.0000 mL/kg/h | INTRAVENOUS | Status: DC

## 2015-11-07 MED ORDER — SODIUM CHLORIDE 0.9 % IV SOLN
INTRAVENOUS | Status: AC
  Administered 2015-11-07 (×2): via INTRAVENOUS

## 2015-11-07 MED ORDER — FENTANYL CITRATE (PF) 100 MCG/2ML IJ SOLN
INTRAMUSCULAR | Status: DC | PRN
  Administered 2015-11-07: 50 ug via INTRAVENOUS

## 2015-11-07 MED ORDER — HEPARIN SODIUM (PORCINE) 1000 UNIT/ML IJ SOLN
INTRAMUSCULAR | Status: AC
  Filled 2015-11-07: qty 1

## 2015-11-07 MED ORDER — ATORVASTATIN CALCIUM 20 MG PO TABS
20.0000 mg | ORAL_TABLET | Freq: Every day | ORAL | Status: DC
  Administered 2015-11-07: 20 mg via ORAL
  Filled 2015-11-07 (×2): qty 1

## 2015-11-07 MED ORDER — SODIUM CHLORIDE 0.9 % IV SOLN: 250.0000 mL | INTRAVENOUS | Status: DC | PRN

## 2015-11-07 MED ORDER — VERAPAMIL HCL 2.5 MG/ML IV SOLN
INTRAVENOUS | Status: DC | PRN
  Administered 2015-11-07: 13:00:00 via INTRA_ARTERIAL

## 2015-11-07 MED ORDER — SODIUM CHLORIDE 0.9% FLUSH: 3.0000 mL | INTRAVENOUS | Status: DC | PRN

## 2015-11-07 MED ORDER — MIDAZOLAM HCL 2 MG/2ML IJ SOLN
INTRAMUSCULAR | Status: DC | PRN
  Administered 2015-11-07: 2 mg via INTRAVENOUS

## 2015-11-07 MED ORDER — LIDOCAINE HCL (PF) 1 % IJ SOLN
INTRAMUSCULAR | Status: DC | PRN
  Administered 2015-11-07: 2 mL

## 2015-11-07 SURGICAL SUPPLY — 10 items
CATH INFINITI 5 FR JL3.5 (CATHETERS) ×2 IMPLANT
CATH INFINITI 5FR ANG PIGTAIL (CATHETERS) ×2 IMPLANT
CATH INFINITI JR4 5F (CATHETERS) ×2 IMPLANT
DEVICE RAD COMP TR BAND LRG (VASCULAR PRODUCTS) ×2 IMPLANT
GLIDESHEATH SLEND SS 6F .021 (SHEATH) ×2 IMPLANT
KIT HEART LEFT (KITS) ×2 IMPLANT
PACK CARDIAC CATHETERIZATION (CUSTOM PROCEDURE TRAY) ×2 IMPLANT
TRANSDUCER W/STOPCOCK (MISCELLANEOUS) ×2 IMPLANT
TUBING CIL FLEX 10 FLL-RA (TUBING) ×2 IMPLANT
WIRE SAFE-T 1.5MM-J .035X260CM (WIRE) ×2 IMPLANT

## 2015-11-07 NOTE — Progress Notes (Signed)
TR band removed from right radial cath site.  Covered with gauze/tegaderm dressing.

## 2015-11-07 NOTE — Interval H&P Note (Signed)
History and Physical Interval Note:  11/07/2015 12:55 PM  Dwayne Harper  has presented today for cardiac cath with the diagnosis of syncope, abnormal stress test.  The various methods of treatment have been discussed with the patient and family. After consideration of risks, benefits and other options for treatment, the patient has consented to  Procedure(s): Left Heart Cath and Coronary Angiography (N/A) as a surgical intervention .  The patient's history has been reviewed, patient examined, no change in status, stable for surgery.  I have reviewed the patient's chart and labs.  Questions were answered to the patient's satisfaction.    Cath Lab Visit (complete for each Cath Lab visit)  Clinical Evaluation Leading to the Procedure:   ACS: No.  Non-ACS:    Anginal Classification: CCS II  Anti-ischemic medical therapy: No Therapy  Non-Invasive Test Results: Intermediate risk stress test  Prior CABG: No previous CABG        [name]

## 2015-11-07 NOTE — Progress Notes (Signed)
[place] for heparin Indication: chest pain/ACS  Allergies  Allergen Reactions  . Lactose Intolerance (Gi)     unknown  . Peanut-Containing Drug Products Nausea And Vomiting  . Penicillins Other (See Comments)    Unknown reaction in childhood Has patient had a PCN reaction causing immediate rash, facial/tongue/throat swelling, SOB or lightheadedness with hypotension: unknown Has patient had a PCN reaction causing severe rash involving mucus membranes or skin necrosis: unknown Has patient had a PCN reaction that required hospitalization: unknown Has patient had a PCN reaction occurring within the last 10 years: No If all of the above answers are "NO", then may proceed with Cephalosporin use.      Patient Measurements: Weight: 183 lb 11.2 oz (83.3 kg) Heparin Dosing Weight: 85.3 kg  Vital Signs: Temp: 97.9 F (36.6 C) (09/01 1315) Temp Source: Oral (09/01 1315) BP: 106/68 (09/01 1315) Pulse Rate: 58 (09/01 1315)  Labs:  Recent Labs  11/05/15 1546 11/06/15 2050 11/07/15 0252 11/07/15 0719  HGB 14.6  --   --  13.8  HCT 42.2  --   --  40.9  PLT 202  --   --  169  APTT  --  34  --  107*  LABPROT 11.4 14.1  --  14.7  INR 1.1 1.08  --  1.14  HEPARINUNFRC  --  0.61  --  0.80*  CREATININE 1.00  --  0.96 0.90  TROPONINI  --  <0.03 <0.03 <0.03    Estimated Creatinine Clearance: 82.7 mL/min (by C-G formula based on SCr of 0.9 mg/dL).   Assessment: 70yoM on Eliquis PTA for DVT (plan for 6 months). Per patient the last apixaban dose was taken at 1000 today. Patient had an abnormal stress test 8/30, recent DVT, and syncope, has been admitted for heparin bridge for cath on Friday. Pharmacy consulted to start heparin tonight for ACS.  AM PTT = 107 seconds  Orders to resume heparin post cath and likely change back to apixaban in the morning. Will resume at 1000 units/hr tonight.  Goal of Therapy:  Heparin level 0.3-0.7 units/ml aPTT  66-102 seconds Monitor platelets by anticoagulation protocol: Yes   Plan:  Resume heparin at 1000 units / hr at 2200 tonight Aptt in the morning  Thank you Erin Hearing PharmD., BCPS Clinical Pharmacist Pager [phone] 11/07/2015 2:30 PM

## 2015-11-07 NOTE — Progress Notes (Signed)
[place] for heparin Indication: chest pain/ACS  Allergies  Allergen Reactions  . Lactose Intolerance (Gi)     unknown  . Peanut-Containing Drug Products Nausea And Vomiting  . Penicillins Other (See Comments)    Unknown reaction in childhood Has patient had a PCN reaction causing immediate rash, facial/tongue/throat swelling, SOB or lightheadedness with hypotension: unknown Has patient had a PCN reaction causing severe rash involving mucus membranes or skin necrosis: unknown Has patient had a PCN reaction that required hospitalization: unknown Has patient had a PCN reaction occurring within the last 10 years: No If all of the above answers are "NO", then may proceed with Cephalosporin use.      Patient Measurements: Weight: 183 lb 11.2 oz (83.3 kg) Heparin Dosing Weight: 85.3 kg  Vital Signs: Temp: 97.8 F (36.6 C) (09/01 0447) Temp Source: Oral (09/01 0447) BP: 107/65 (09/01 0447) Pulse Rate: 76 (09/01 0447)  Labs:  Recent Labs  11/05/15 1546 11/06/15 2050 11/07/15 0252 11/07/15 0719  HGB 14.6  --   --  13.8  HCT 42.2  --   --  40.9  PLT 202  --   --  169  APTT  --  34  --  107*  LABPROT 11.4 14.1  --  14.7  INR 1.1 1.08  --  1.14  HEPARINUNFRC  --  0.61  --  0.80*  CREATININE 1.00  --  0.96 0.90  TROPONINI  --  <0.03 <0.03 <0.03    Estimated Creatinine Clearance: 82.7 mL/min (by C-G formula based on SCr of 0.9 mg/dL).   Assessment: 70yoM on Eliquis PTA for DVT (plan for 6 months). Per patient the last apixaban dose was taken at 1000 today. Patient had an abnormal stress test 8/30, recent DVT, and syncope, has been admitted for heparin bridge for cath on Friday. Pharmacy consulted to start heparin tonight for ACS.  AM PTT = 107 seconds   Goal of Therapy:  Heparin level 0.3-0.7 units/ml aPTT 66-102 seconds Monitor platelets by anticoagulation protocol: Yes   Plan:  Decrease heparin to 1000 units / hr Follow plans  for cath or 6 hr PTT  Thank you Anette Guarneri, PharmD [phone] 11/07/2015,9:37 AM

## 2015-11-07 NOTE — Research (Signed)
CAD LAD Informed Consent   Subject Name: Dwayne Harper  Subject met inclusion and exclusion criteria.  The informed consent form, study requirements and expectations were reviewed with the subject and questions and concerns were addressed prior to the signing of the consent form.  The subject verbalized understanding of the trail requirements.  The subject agreed to participate in the CAD LAD trial and signed the informed consent.  The informed consent was obtained prior to performance of any protocol-specific procedures for the subject.  A copy of the signed informed consent was given to the subject and a copy was placed in the subject's medical record.  [name],[name] 11/07/2015, 2263

## 2015-11-07 NOTE — Progress Notes (Signed)
Patient Name: Dwayne Harper Date of Encounter: 11/07/2015  Hospital Problem List     Active Problems:   Thrombocytopenia (HCC)   History of DVT (deep vein thrombosis)   Abnormal stress test   Abnormal nuclear stress test    Subjective   Feeling well this morning, waiting for cath.   Inpatient Medications    . aspirin EC  81 mg Oral Daily  . pantoprazole  40 mg Oral Daily  . sodium chloride flush  3 mL Intravenous Q12H  . sodium chloride flush  3 mL Intravenous Q12H  . tamsulosin  0.4 mg Oral QHS    Vital Signs    Vitals:   11/06/15 1923 11/07/15 0447  BP: 114/76 107/65  Pulse: 67 76  Resp: 18 16  Temp: 98.2 F (36.8 C) 97.8 F (36.6 C)  TempSrc: Oral Oral  SpO2: 97% 95%  Weight:  183 lb 11.2 oz (83.3 kg)    Intake/Output Summary (Last 24 hours) at 11/07/15 0900 Last data filed at 11/07/15 0447  Gross per 24 hour  Intake                0 ml  Output              250 ml  Net             -250 ml   Filed Weights   11/07/15 0447  Weight: 183 lb 11.2 oz (83.3 kg)    Physical Exam    General: Pleasant, NAD. Neuro: Alert and oriented X 3. Moves all extremities spontaneously. Psych: Normal affect. HEENT:  Normal  Neck: Supple without bruits or JVD. Lungs:  Resp regular and unlabored, CTA. Heart: RRR no s3, s4, or murmurs. Abdomen: Soft, non-tender, non-distended, BS + x 4.  Extremities: No clubbing, cyanosis or edema. DP/PT/Radials 2+ and equal bilaterally.  Labs    CBC  Recent Labs  11/05/15 1546 11/07/15 0719  WBC 5.8 4.7  NEUTROABS 2,958  --   HGB 14.6 13.8  HCT 42.2 40.9  MCV 90.0 91.7  PLT 202 [id number]   Basic Metabolic Panel  Recent Labs  11/05/15 1546 11/07/15 0252  NA 138 139  K 4.4 4.0  CL 102 107  CO2 28 25  GLUCOSE 89 98  BUN 18 20  CREATININE 1.00 0.96  CALCIUM 8.9 8.4*   Liver Function Tests  Recent Labs  11/05/15 1546 11/07/15 0252  AST 21 21  ALT 18 20  ALKPHOS 51 43  BILITOT 0.3 0.5  PROT 6.6 5.6*  ALBUMIN  3.8 3.1*   No results for input(s): LIPASE, AMYLASE in the last 72 hours. Cardiac Enzymes  Recent Labs  11/06/15 2050 11/07/15 0252  TROPONINI <0.03 <0.03   BNP Invalid input(s): POCBNP D-Dimer No results for input(s): DDIMER in the last 72 hours. Hemoglobin A1C No results for input(s): HGBA1C in the last 72 hours. Fasting Lipid Panel  Recent Labs  11/07/15 0252  CHOL 204*  HDL 59  LDLCALC 139*  TRIG 30  CHOLHDL 3.5   Thyroid Function Tests No results for input(s): TSH, T4TOTAL, T3FREE, THYROIDAB in the last 72 hours.  Invalid input(s): FREET3  Telemetry    SR  ECG    SR   Radiology    No results found.  Assessment & Plan    70 y.o.malewith history of BPH, Barrett's esophagus, ED, GERD, hiatal hernia, remote testicular cancer s/p orchiectomy, recent syncope and DVT who presented to the office on 11/05/15 to discuss  abnormal stress test. Decision was made to proceed with cath via Dr. Rayann Heman.   1. Abnormal stress test: ETT with Significant ST depression in stage II and early recovery worrisome for ischemia. He was admitted overnight and started on IV heparin with plans for cath 7:30 this morning. There was some confusion regarding his Eliquis, and he did report taking a dose yesterday morning. Given this his cath will be delayed until this afternoon. I have discussed this with the patient and he voiced understanding.  -- morning labs with stable Cr and Hgb. Trop neg x3. Currently on heparin gtt.  -- Abnormal lipid panel. May need addition of statin, will assess after cath.  2. Hx of DVT: Eliquis held.   3. Syncope: This event lead to his stress test, which resulted as abnormal. Plans to cath today.   Signed, Dwayne Bellis NP-C Pager [phone]   History and all data above reviewed.  Patient examined.  I agree with the findings as above.  No chest pain.  No SOB. The patient exam reveals COR:RRR  ,  Lungs: Clear  ,  Abd: Positive bowel sounds, no rebound  no guarding, Ext No edema  .  All available labs, radiology testing, previous records reviewed. Agree with documented assessment and plan. Abnormal stress test:  Cath today.  DVT:  Likely related to a long flight a few weeks before the diagnosis.  Off Eliquis for now.  He will need 3 - 6 months of this total.    Minus Breeding  11:48 AM  11/07/2015

## 2015-11-07 NOTE — H&P (View-Only) (Signed)
Patient Name: Dwayne Harper Date of Encounter: 11/07/2015  Hospital Problem List     Active Problems:   Thrombocytopenia (HCC)   History of DVT (deep vein thrombosis)   Abnormal stress test   Abnormal nuclear stress test    Subjective   Feeling well this morning, waiting for cath.   Inpatient Medications    . aspirin EC  81 mg Oral Daily  . pantoprazole  40 mg Oral Daily  . sodium chloride flush  3 mL Intravenous Q12H  . sodium chloride flush  3 mL Intravenous Q12H  . tamsulosin  0.4 mg Oral QHS    Vital Signs    Vitals:   11/06/15 1923 11/07/15 0447  BP: 114/76 107/65  Pulse: 67 76  Resp: 18 16  Temp: 98.2 F (36.8 C) 97.8 F (36.6 C)  TempSrc: Oral Oral  SpO2: 97% 95%  Weight:  183 lb 11.2 oz (83.3 kg)    Intake/Output Summary (Last 24 hours) at 11/07/15 0900 Last data filed at 11/07/15 0447  Gross per 24 hour  Intake                0 ml  Output              250 ml  Net             -250 ml   Filed Weights   11/07/15 0447  Weight: 183 lb 11.2 oz (83.3 kg)    Physical Exam    General: Pleasant, NAD. Neuro: Alert and oriented X 3. Moves all extremities spontaneously. Psych: Normal affect. HEENT:  Normal  Neck: Supple without bruits or JVD. Lungs:  Resp regular and unlabored, CTA. Heart: RRR no s3, s4, or murmurs. Abdomen: Soft, non-tender, non-distended, BS + x 4.  Extremities: No clubbing, cyanosis or edema. DP/PT/Radials 2+ and equal bilaterally.  Labs    CBC  Recent Labs  11/05/15 1546 11/07/15 0719  WBC 5.8 4.7  NEUTROABS 2,958  --   HGB 14.6 13.8  HCT 42.2 40.9  MCV 90.0 91.7  PLT 202 [id number]   Basic Metabolic Panel  Recent Labs  11/05/15 1546 11/07/15 0252  NA 138 139  K 4.4 4.0  CL 102 107  CO2 28 25  GLUCOSE 89 98  BUN 18 20  CREATININE 1.00 0.96  CALCIUM 8.9 8.4*   Liver Function Tests  Recent Labs  11/05/15 1546 11/07/15 0252  AST 21 21  ALT 18 20  ALKPHOS 51 43  BILITOT 0.3 0.5  PROT 6.6 5.6*  ALBUMIN  3.8 3.1*   No results for input(s): LIPASE, AMYLASE in the last 72 hours. Cardiac Enzymes  Recent Labs  11/06/15 2050 11/07/15 0252  TROPONINI <0.03 <0.03   BNP Invalid input(s): POCBNP D-Dimer No results for input(s): DDIMER in the last 72 hours. Hemoglobin A1C No results for input(s): HGBA1C in the last 72 hours. Fasting Lipid Panel  Recent Labs  11/07/15 0252  CHOL 204*  HDL 59  LDLCALC 139*  TRIG 30  CHOLHDL 3.5   Thyroid Function Tests No results for input(s): TSH, T4TOTAL, T3FREE, THYROIDAB in the last 72 hours.  Invalid input(s): FREET3  Telemetry    SR  ECG    SR   Radiology    No results found.  Assessment & Plan    70 y.o.malewith history of BPH, Barrett's esophagus, ED, GERD, hiatal hernia, remote testicular cancer s/p orchiectomy, recent syncope and DVT who presented to the office on 11/05/15 to discuss  abnormal stress test. Decision was made to proceed with cath via Dr. Rayann Heman.   1. Abnormal stress test: ETT with Significant ST depression in stage II and early recovery worrisome for ischemia. He was admitted overnight and started on IV heparin with plans for cath 7:30 this morning. There was some confusion regarding his Eliquis, and he did report taking a dose yesterday morning. Given this his cath will be delayed until this afternoon. I have discussed this with the patient and he voiced understanding.  -- morning labs with stable Cr and Hgb. Trop neg x3. Currently on heparin gtt.  -- Abnormal lipid panel. May need addition of statin, will assess after cath.  2. Hx of DVT: Eliquis held.   3. Syncope: This event lead to his stress test, which resulted as abnormal. Plans to cath today.   Signed, Reino Bellis NP-C Pager [phone]   History and all data above reviewed.  Patient examined.  I agree with the findings as above.  No chest pain.  No SOB. The patient exam reveals COR:RRR  ,  Lungs: Clear  ,  Abd: Positive bowel sounds, no rebound  no guarding, Ext No edema  .  All available labs, radiology testing, previous records reviewed. Agree with documented assessment and plan. Abnormal stress test:  Cath today.  DVT:  Likely related to a long flight a few weeks before the diagnosis.  Off Eliquis for now.  He will need 3 - 6 months of this total.    Minus Breeding  11:48 AM  11/07/2015

## 2015-11-08 ENCOUNTER — Encounter (HOSPITAL_COMMUNITY): Payer: Self-pay | Admitting: Physician Assistant

## 2015-11-08 DIAGNOSIS — I2510 Atherosclerotic heart disease of native coronary artery without angina pectoris: Secondary | ICD-10-CM

## 2015-11-08 DIAGNOSIS — I25812 Atherosclerosis of bypass graft of coronary artery of transplanted heart without angina pectoris: Secondary | ICD-10-CM

## 2015-11-08 DIAGNOSIS — E785 Hyperlipidemia, unspecified: Secondary | ICD-10-CM

## 2015-11-08 DIAGNOSIS — Z86718 Personal history of other venous thrombosis and embolism: Secondary | ICD-10-CM | POA: Diagnosis not present

## 2015-11-08 DIAGNOSIS — R931 Abnormal findings on diagnostic imaging of heart and coronary circulation: Secondary | ICD-10-CM | POA: Diagnosis not present

## 2015-11-08 HISTORY — DX: Hyperlipidemia, unspecified: E78.5

## 2015-11-08 LAB — BASIC METABOLIC PANEL
Anion gap: 6 (ref 5–15)
BUN: 13 mg/dL (ref 6–20)
CALCIUM: 8.6 mg/dL — AB (ref 8.9–10.3)
CO2: 26 mmol/L (ref 22–32)
CREATININE: 0.97 mg/dL (ref 0.61–1.24)
Chloride: 107 mmol/L (ref 101–111)
GFR calc non Af Amer: >60 mL/min (ref >60)
Glucose, Bld: 105 mg/dL — ABNORMAL HIGH (ref 65–99)
Potassium: 5 mmol/L (ref 3.5–5.1)
Sodium: 139 mmol/L (ref 135–145)

## 2015-11-08 LAB — CBC
HCT: 42.6 % (ref 39.0–52.0)
Hemoglobin: 14.4 g/dL (ref 13.0–17.0)
MCH: 30.8 pg (ref 26.0–34.0)
MCHC: 33.8 g/dL (ref 30.0–36.0)
MCV: 91.2 fL (ref 78.0–100.0)
Platelets: 171 10*3/uL (ref 150–400)
RBC: 4.67 MIL/uL (ref 4.22–5.81)
RDW: 12.5 % (ref 11.5–15.5)
WBC: 6.1 10*3/uL (ref 4.0–10.5)

## 2015-11-08 LAB — APTT: APTT: 93 s — AB (ref 24–36)

## 2015-11-08 MED ORDER — APIXABAN 5 MG PO TABS
5.0000 mg | ORAL_TABLET | Freq: Two times a day (BID) | ORAL | Status: DC
  Administered 2015-11-08: 5 mg via ORAL
  Filled 2015-11-08: qty 1

## 2015-11-08 MED ORDER — ATORVASTATIN CALCIUM 20 MG PO TABS: 20.0000 mg | ORAL_TABLET | Freq: Every evening | ORAL | 6 refills | Status: DC

## 2015-11-08 MED ORDER — NITROGLYCERIN 0.4 MG SL SUBL: 0.4000 mg | SUBLINGUAL_TABLET | SUBLINGUAL | 3 refills | Status: DC | PRN

## 2015-11-08 MED ORDER — ASPIRIN 81 MG PO TBEC: 81.0000 mg | DELAYED_RELEASE_TABLET | Freq: Every day | ORAL | 6 refills | Status: DC

## 2015-11-08 NOTE — Progress Notes (Signed)
ANTICOAGULATION CONSULT NOTE  Pharmacy Consult for heparin Indication: chest pain/ACS   Assessment: 70yoM on Eliquis PTA for DVT (plan for 6 months). Per patient the last apixaban dose was taken at 1000 today. Patient had an abnormal stress test 8/30, recent DVT, and syncope, has been admitted for heparin bridge for cath on Friday. Pharmacy consulted to start heparin tonight for ACS.  Orders to resume heparin post cath yesterday. Aptt at goal this am on 1000/hr. Will likely resume apixaban today.  Goal of Therapy:  Heparin level 0.3-0.7 units/ml aPTT 66-102 seconds Monitor platelets by anticoagulation protocol: Yes   Plan:  Continue heparin at 1000 units / hr  Aptt/HL in the morning   Allergies  Allergen Reactions  . Lactose Intolerance (Gi)     unknown  . Peanut-Containing Drug Products Nausea And Vomiting  . Penicillins Other (See Comments)    Unknown reaction in childhood Has patient had a PCN reaction causing immediate rash, facial/tongue/throat swelling, SOB or lightheadedness with hypotension: unknown Has patient had a PCN reaction causing severe rash involving mucus membranes or skin necrosis: unknown Has patient had a PCN reaction that required hospitalization: unknown Has patient had a PCN reaction occurring within the last 10 years: No If all of the above answers are "NO", then may proceed with Cephalosporin use.      Patient Measurements: Weight: 183 lb 6.4 oz (83.2 kg) Heparin Dosing Weight: 85.3 kg  Vital Signs: Temp: 98.2 F (36.8 C) (09/02 0430) Temp Source: Oral (09/02 0430) BP: 122/67 (09/02 0430) Pulse Rate: 63 (09/02 0430)  Labs:  Recent Labs  11/05/15 1546 11/06/15 2050 11/07/15 0252 11/07/15 0719 11/08/15 0528  HGB 14.6  --   --  13.8 14.4  HCT 42.2  --   --  40.9 42.6  PLT 202  --   --  169 171  APTT  --  34  --  107* 93*  LABPROT 11.4 14.1  --  14.7  --   INR 1.1 1.08  --  1.14  --   HEPARINUNFRC  --  0.61  --  0.80*  --    CREATININE 1.00  --  0.96 0.90 0.97  TROPONINI  --  <0.03 <0.03 <0.03  --     Estimated Creatinine Clearance: 76.6 mL/min (by C-G formula based on SCr of 0.97 mg/dL).    Thank you Erin Hearing PharmD., BCPS Clinical Pharmacist Pager [phone] 11/08/2015 10:43 AM

## 2015-11-08 NOTE — Progress Notes (Signed)
Discharged to home with family office visits in place teaching done  

## 2015-11-08 NOTE — Discharge Summary (Signed)
Discharge Summary    Patient ID: Marciano Mega,  MRN: [id number], DOB/AGE: 06-18-1945 70 y.o.  Admit date: 11/06/2015 Discharge date: 11/08/2015  Primary Care Provider: Irven Shelling Primary Cardiologist: Dr. Radford Pax  Discharge Diagnoses    Principal Problem:   Abnormal stress test Active Problems:   CAD in native artery   Syncope and collapse   Thrombocytopenia (HCC)   History of DVT (deep vein thrombosis)   Abnormal nuclear stress test   Hyperlipidemia    Diagnostic Studies/Procedures    1. Cardiac catheterization this admission, please see full report and below for summary.  _____________   History of Present Ruth a 70 y.o.malemalewith history of BPH, Barrett's esophagus, ED, GERD, hiatal hernia, remote testicular cancer s/p orchiectomy, recent syncope and DVT who presented to [facility] for planned cath.  He was admitted 09/2015 with abrupt syncope - under work stress for several weeks prior with very little sleep leading up. EEG normal. Tele without arrhythmias. 2D Echo 09/27/15: EF 60-65%, grossly normal diastolic function, trivial TR, normal PASP. MR brain normal. He was found to have LLE DVT 09/28/15; CT without PE but did show coronary calcificaiton - prolonged travel in May felt to be the cause, recommended for Eliquis x 6 months. Dr. Rayann Heman felt syncope was of unclear etiology, recommended no driving x 6 months. He had reviewed the patient's monitor wihtout significant arrhythmias thus far (final read pending). He recommended ETT which was done 10/24/15 showing significant ST depression in stage II and early recovery worrisome for ischemia. Recent labs otherwise notable for mild thrombocytopenia plt 122-131, Hgb wnl, Cr 0.90, UDS neg. The patient was seen in the office 11/06/15 to discuss cath - he was actually floored given complete lack of any cardiac symptoms of any chest pain, SOB or functional limitation. Given his syncope and  abnormal nuc the decision was made to proceed with admission the following day for heparin bridge given recent DVT. The case was originally discussed with Dr. Angelena Form who was OK with him taking Eliquis the AM of 11/06/15 with heparin per pharmacy starting the following night, with plan for [facility] 11/07/15 with Dr. Angelena Form. Due to cath lab adjustments the patient was placed on Dr. Thompson Caul schedule who wished to push back the timing of cath to later in the day, given his dose of Eliquis the AM of 11/06/15. Dr. Angelena Form ultimately ended up doing the case showing moderate CAD, not felt to be the cause for his syncope - 65% prox-mid Cx, 70% OM1, 40% mid Cx, 30% ostial-mid LAD, 60% ostial D1, 20% prox-mid RCA. Medical therapy was recommended. He did well post cath. Eliquis was resumed this AM. Statin added for LDL 139. ASA was started this admission, continued given his above CAD. Dr. Radford Pax has seen and examined the patient today and feels he is stable for discharge.  If the patient is tolerating statin at time of follow-up appointment, would consider rechecking liver function/lipid panel in 6 weeks.  _____________  Discharge Vitals Blood pressure 122/67, pulse 63, temperature 98.2 F (36.8 C), temperature source Oral, resp. rate 16, weight 183 lb 6.4 oz (83.2 kg), SpO2 97 %.  Filed Weights   11/07/15 0447 11/08/15 0430  Weight: 183 lb 11.2 oz (83.3 kg) 183 lb 6.4 oz (83.2 kg)    Labs & Radiologic Studies    CBC  Recent Labs  11/05/15 1546 11/07/15 0719 11/08/15 0528  WBC 5.8 4.7 6.1  NEUTROABS 2,958  --   --  HGB 14.6 13.8 14.4  HCT 42.2 40.9 42.6  MCV 90.0 91.7 91.2  PLT 202 169 [id number]   Basic Metabolic Panel  Recent Labs  11/07/15 0719 11/08/15 0528  NA 138 139  K 3.9 5.0  CL 108 107  CO2 25 26  GLUCOSE 100* 105*  BUN 17 13  CREATININE 0.90 0.97  CALCIUM 8.3* 8.6*   Liver Function Tests  Recent Labs  11/05/15 1546 11/07/15 0252  AST 21 21  ALT 18 20  ALKPHOS 51 43    BILITOT 0.3 0.5  PROT 6.6 5.6*  ALBUMIN 3.8 3.1*   No results for input(s): LIPASE, AMYLASE in the last 72 hours. Cardiac Enzymes  Recent Labs  11/06/15 2050 11/07/15 0252 11/07/15 0719  TROPONINI <0.03 <0.03 <0.03   Fasting Lipid Panel  Recent Labs  11/07/15 0252  CHOL 204*  HDL 59  LDLCALC 139*  TRIG 30  CHOLHDL 3.5  _____________  No results found. Disposition   Pt is being discharged home today in good condition.  Follow-up Plans & Appointments     Discharge Instructions    Diet - low sodium heart healthy    Complete by:  As directed   Increase activity slowly    Complete by:  As directed   No driving for 6 months from your original date of passing out in July. No lifting over 5 lbs for 1 week. No sexual activity for 1 week. Keep procedure site clean & dry. If you notice increased pain, swelling, bleeding or pus, call/return!  You may shower, but no soaking baths/hot tubs/pools for 1 week.  Your doctor has started you on aspirin and atorvastatin to reduce risk for future cardiovascular events. If you notice any bleeding such as blood in stool, black tarry stools, blood in urine, nosebleeds or any other unusual bleeding, call your doctor immediately. You were also given a prescription for nitroglycerin for chest pain if you need it.    Has f/u scheduled with me on 11/21/15. Appt autopopulates in Epic.   Discharge Medications     Medication List    TAKE these medications   aspirin 81 MG EC tablet Take 1 tablet (81 mg total) by mouth daily.   atorvastatin 20 MG tablet Commonly known as:  LIPITOR Take 1 tablet (20 mg total) by mouth every evening.   CO Q 10 PO Take 1 tablet by mouth at bedtime.   ELIQUIS 5 MG Tabs tablet Generic drug:  apixaban Take 5 mg by mouth 2 (two) times daily.   Fish Oil 1000 MG Caps Take 1 capsule by mouth at bedtime.   glucosamine-chondroitin 500-400 MG tablet Take 1 tablet by mouth at bedtime.   multivitamin  tablet Take 1 tablet by mouth at bedtime.   nitroGLYCERIN 0.4 MG SL tablet Commonly known as:  NITROSTAT Place 1 tablet (0.4 mg total) under the tongue every 5 (five) minutes as needed for chest pain (up to 3 doses).   omeprazole 20 MG capsule Commonly known as:  PRILOSEC Take 20 mg by mouth at bedtime.   OVER THE COUNTER MEDICATION Take 1 tablet by mouth at bedtime. Focus Factor   tamsulosin 0.4 MG Caps capsule Commonly known as:  FLOMAX Take 0.4 mg by mouth at bedtime.   VITAMIN B-12 PO Take 1 tablet by mouth at bedtime.         Allergies:  Allergies  Allergen Reactions  . Lactose Intolerance (Gi)     unknown  . Peanut-Containing Drug Products  Nausea And Vomiting  . Penicillins Other (See Comments)    Unknown reaction in childhood Has patient had a PCN reaction causing immediate rash, facial/tongue/throat swelling, SOB or lightheadedness with hypotension: unknown Has patient had a PCN reaction causing severe rash involving mucus membranes or skin necrosis: unknown Has patient had a PCN reaction that required hospitalization: unknown Has patient had a PCN reaction occurring within the last 10 years: No If all of the above answers are "NO", then may proceed with Cephalosporin use.       Outstanding Labs/Studies   na  Duration of Discharge Encounter   Greater than 30 minutes including physician time.  Signed, Nedra Hai  PA-C 11/08/2015, 1:35 PM

## 2015-11-08 NOTE — Progress Notes (Addendum)
SUBJECTIVE:  No complaints  OBJECTIVE:   Vitals:   Vitals:   11/07/15 1545 11/07/15 1615 11/07/15 1645 11/08/15 0430  BP: 127/72 112/60 116/70 122/67  Pulse: 73 71 61 63  Resp: 18  18 16   Temp:    98.2 F (36.8 C)  TempSrc:    Oral  SpO2: 97% 95% 98% 97%  Weight:    183 lb 6.4 oz (83.2 kg)   I&O's:   Intake/Output Summary (Last 24 hours) at 11/08/15 1230 Last data filed at 11/08/15 0609  Gross per 24 hour  Intake               85 ml  Output                0 ml  Net               85 ml   TELEMETRY: Reviewed telemetry pt in NSR:     PHYSICAL EXAM General: Well developed, well nourished, in no acute distress Head: Eyes PERRLA, No xanthomas.   Normal cephalic and atramatic  Lungs:   Clear bilaterally to auscultation and percussion. Heart:   HRRR S1 S2 Pulses are 2+ & equal. Abdomen: Bowel sounds are positive, abdomen soft and non-tender without masses o Msk:  Back normal, normal gait. Normal strength and tone for age. Extremities:   No clubbing, cyanosis or edema.  DP +1 Neuro: Alert and oriented X 3. Psych:  Good affect, responds appropriately   LABS: Basic Metabolic Panel:  Recent Labs  11/07/15 0719 11/08/15 0528  NA 138 139  K 3.9 5.0  CL 108 107  CO2 25 26  GLUCOSE 100* 105*  BUN 17 13  CREATININE 0.90 0.97  CALCIUM 8.3* 8.6*   Liver Function Tests:  Recent Labs  11/05/15 1546 11/07/15 0252  AST 21 21  ALT 18 20  ALKPHOS 51 43  BILITOT 0.3 0.5  PROT 6.6 5.6*  ALBUMIN 3.8 3.1*   No results for input(s): LIPASE, AMYLASE in the last 72 hours. CBC:  Recent Labs  11/05/15 1546 11/07/15 0719 11/08/15 0528  WBC 5.8 4.7 6.1  NEUTROABS 2,958  --   --   HGB 14.6 13.8 14.4  HCT 42.2 40.9 42.6  MCV 90.0 91.7 91.2  PLT 202 169 171   Cardiac Enzymes:  Recent Labs  11/06/15 2050 11/07/15 0252 11/07/15 0719  TROPONINI <0.03 <0.03 <0.03   BNP: Invalid input(s): POCBNP D-Dimer: No results for input(s): DDIMER in the last 72  hours. Hemoglobin A1C: No results for input(s): HGBA1C in the last 72 hours. Fasting Lipid Panel:  Recent Labs  11/07/15 0252  CHOL 204*  HDL 59  LDLCALC 139*  TRIG 30  CHOLHDL 3.5   Thyroid Function Tests: No results for input(s): TSH, T4TOTAL, T3FREE, THYROIDAB in the last 72 hours.  Invalid input(s): FREET3 Anemia Panel: No results for input(s): VITAMINB12, FOLATE, FERRITIN, TIBC, IRON, RETICCTPCT in the last 72 hours. Coag Panel:   Lab Results  Component Value Date   INR 1.14 11/07/2015   INR 1.08 11/06/2015   INR 1.1 11/05/2015    RADIOLOGY: No results found.   Assessment & Plan    70 y.o.malewith history of BPH, Barrett's esophagus, ED, GERD, hiatal hernia, remote testicular cancer s/p orchiectomy, recent syncope and DVT who presented to the office on 11/05/15 to discuss abnormal stress test. Decision was made to proceed with cath via Dr. Rayann Heman.   1. Abnormal stress test: ETT with Significant ST depression  in stage II and early recovery worrisome for ischemia. He was admitted overnight and started on IV heparin with plans for cath. Cath revealed 65% prox LCx, 70% OM1, 40% mid LCX, 60% D1, 20% prox RCA with diffuse disease. Now on medical management. Continue ASA/statin and BB.  He has not had any angina.   2. Hx of DVT: Restart Eliquis this am.   3. Syncope: This event lead to his stress test, which resulted as abnormal. See above.  4.  Hyperlipidemia - LDL not at goal (139).  Statin started.  Will need FLP and ALT in 6 weeks.   Patient is stable for discharge from a cardiac standpoint.  Restart Eliquis.  Continue ASA as well due to moderate CAD.  Followup in clinic in a few weeks. He was told at prior appt that he should not drive for 6 months due to abrupt syncope in July.  I reiterated this to him today.    Fransico Him, MD  11/08/2015  12:30 PM

## 2015-11-11 ENCOUNTER — Encounter (HOSPITAL_COMMUNITY): Payer: Self-pay | Admitting: Cardiovascular Disease

## 2015-11-11 ENCOUNTER — Telehealth: Payer: Self-pay | Admitting: Internal Medicine

## 2015-11-11 NOTE — Telephone Encounter (Signed)
Left message on machine, that he is not to drive for 6 months due to his syncopal episode and the NTG because he has CAD and he can use for CP.  Should have been given instructions

## 2015-11-11 NOTE — Telephone Encounter (Signed)
New Message  Pt wife called requesting to speak with RN  Pt c/o medication issue:  1. Name of Medication: Nitroglycerin   2. How are you currently taking this medication (dosage and times per day)? 0.4  3. Are you having a reaction (difficulty breathing--STAT)? Per pt wife no  4. What is your medication issue? Pt wife states pt released from the hospital and was prescribed the med and wants to know why pt was put on the med. Pt wife states pt was instructed not allow to drive for the next 6 months and wants to know why he is not allowed to drive. Please call back to dicuss

## 2015-11-20 NOTE — Progress Notes (Signed)
Cardiology Office Note    Date:  11/21/2015  ID:  Dwayne Harper, DOB [birth year], MRN [id number] PCP:  Dwayne Shelling, MD  Cardiologist:  Dwayne Harper  Chief Complaint: f/u cath  History of Present Illness:  Dwayne Harper is a 70 y.o. male with history of BPH, Barrett's esophagus, ED, GERD, hiatal hernia, remote testicular cancer s/p orchiectomy, recent syncope & DVT, CAD who presents for post-cath follow-up.  He was admitted 09/2015 with abrupt syncope - had been under work stress for several weeks prior with very little sleep leading up. EEG normal. Tele without arrhythmias. 2D Echo 09/27/15: EF 60-65%, grossly normal diastolic function, trivial TR, normal PASP. MR brain normal. He was found to have LLE DVT 09/28/15; CT without PE but did show coronary calcificaiton - prolonged travel in May felt to be the cause, recommended for Eliquis x 6 months. Dr. Rayann Harper felt syncope was of unclear etiology, recommended no driving x 6 months. Event monitor showed no significant arrhythmias. He recommended ETT which was done 10/24/15 showing ST depression in stage II and early recovery worrisome for ischemia. He was set up for [facility] 11/07/15 demonstrating moderate CAD, not felt to be the cause for his syncope - 65% prox-mid Cx, 70% OM1, 40% mid Cx, 30% ostial-mid LAD, 60% ostial D1, 20% prox-mid RCA. Medical therapy was recommended. Eliquis was resumed, statin added for LDL 139, and Dr. Radford Pax recommended to continue aspirin.  He returns for follow-up doing very well. He is upset that his discharge too so long from the hospital the day he went home. I explained to him that we unfortunately had higher volume than normal and wanted to make sure we do due diligence for each discharge rather than rushing people out the door. (This was a day we had 8 nuclear stress tests on a Saturday.) He is doing quite well otherwise. He denies any chest pain, SOB, nausea, exercise intolerance or recurrent syncope.   Past Medical  History:  Diagnosis Date  . Asthma   . Barrett esophagus   . BPH (benign prostatic hyperplasia)   . CAD in native artery    a. Abnl ETT-> LHC 11/07/15 - 65% prox-mid Cx, 70% OM1, 40% mid Cx, 30% ostial-mid LAD, 60% ostial D1, 20% prox-mid RCA.  Marland Kitchen DVT (deep venous thrombosis) ([place]) 09/2015   LLE  . ED (erectile dysfunction)   . GERD (gastroesophageal reflux disease)   . Hiatal hernia    endoscopy   . History of gout   . Hyperlipidemia 11/08/2015  . Pneumonia 1950s  . Scalp hematoma   . Syncope 09/26/2015  . Testicular cancer [facility])    orchiectomy  . Tubular adenoma     Past Surgical History:  Procedure Laterality Date  . CARDIAC CATHETERIZATION N/A 11/07/2015   Procedure: Left Heart Cath and Coronary Angiography;  Surgeon: Burnell Blanks, MD;  Location: [place] [facility] LAB;  Service: Cardiovascular;  Laterality: N/A;  . COLONOSCOPY  10/2013   tubular adenoma  . TESTICLE REMOVAL Left 1997   cancer  . TONSILLECTOMY    . WISDOM TOOTH EXTRACTION      Current Medications: Current Outpatient Prescriptions  Medication Sig Dispense Refill  . apixaban (ELIQUIS) 5 MG TABS tablet Take 5 mg by mouth 2 (two) times daily.    Marland Kitchen aspirin EC 81 MG EC tablet Take 1 tablet (81 mg total) by mouth daily. 30 tablet 6  . atorvastatin (LIPITOR) 20 MG tablet Take 1 tablet (20 mg total) by mouth every evening. [place]  tablet 6  . Coenzyme Q10 (CO Q 10 PO) Take 1 tablet by mouth at bedtime.     . Cyanocobalamin (VITAMIN B-12 PO) Take 1 tablet by mouth at bedtime.     Marland Kitchen glucosamine-chondroitin 500-400 MG tablet Take 1 tablet by mouth at bedtime.     . Multiple Vitamin (MULTIVITAMIN) tablet Take 1 tablet by mouth at bedtime.     . nitroGLYCERIN (NITROSTAT) 0.4 MG SL tablet Place 1 tablet (0.4 mg total) under the tongue every 5 (five) minutes as needed for chest pain (up to 3 doses). 25 tablet 3  . Omega-3 Fatty Acids (FISH OIL) 1000 MG CAPS Take 1 capsule by mouth at bedtime.     Marland Kitchen omeprazole (PRILOSEC)  20 MG capsule Take 20 mg by mouth at bedtime.     Marland Kitchen OVER THE COUNTER MEDICATION Take 1 tablet by mouth at bedtime. Focus Factor    . tamsulosin (FLOMAX) 0.4 MG CAPS capsule Take 0.4 mg by mouth at bedtime.      No current facility-administered medications for this visit.      Allergies:   Lactose intolerance (gi); Peanut-containing drug products; and Penicillins   Social History   Social History  . Marital status: Married    Spouse name: N/A  . Number of children: N/A  . Years of education: N/A   Social History Main Topics  . Smoking status: Former Smoker    Years: 3.00    Types: Cigarettes  . Smokeless tobacco: Never Used     Comment: "quit before 1975"  . Alcohol use 1.8 oz/week    2 Glasses of wine, 1 Cans of beer per week  . Drug use: No  . Sexual activity: Not Currently   Other Topics Concern  . None   Social History Narrative   Pt lives in [place] with spouse.  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.  Wife is endowed chair at [employer] in [employer].     Family History:  The patient's family history includes Aneurysm in his father; Atrial fibrillation in his mother; CAD (age of onset: 32) in his mother; Coronary artery disease in his father; Deep vein thrombosis in his daughter; Deep vein thrombosis (age of onset: 50) in his father; Dementia in his mother; Heart attack in his mother; Heart disease in his mother; Hypertension in his father and mother; Prostate cancer in his father.   ROS:   Please see the history of present illness.  All other systems are reviewed and otherwise negative.    PHYSICAL EXAM:   VS:  BP 112/82   Pulse (!) 59   Ht 5' 8.5" (1.74 m)   Wt 187 lb 12.8 oz (85.2 kg)   BMI 28.14 kg/m   BMI: Body mass index is 28.14 kg/m. GEN: Well nourished, well developed WM, in no acute distress  HEENT: normocephalic, atraumatic Neck: no JVD, carotid bruits, or masses Cardiac: RRR; no murmurs, rubs, or gallops, no edema    Respiratory:  clear to auscultation bilaterally, normal work of breathing GI: soft, nontender, nondistended, + BS MS: no deformity or atrophy  Skin: warm and dry, no rash, right radial cath site without hematoma or ecchymosis; good pulse. Neuro:  Alert and Oriented x 3, Strength and sensation are intact, follows commands Psych: euthymic mood, full affect  Wt Readings from Last 3 Encounters:  11/21/15 187 lb 12.8 oz (85.2 kg)  11/08/15 183 lb 6.4 oz (83.2 kg)  11/05/15 188 lb (  85.3 kg)      Studies/Labs Reviewed:   EKG:  EKG was ordered today and personally reviewed by me and demonstrates SB 59bpm, otherwise normal.  Recent Labs: 11/07/2015: ALT 20 11/08/2015: BUN 13; Creatinine, Ser 0.97; Hemoglobin 14.4; Platelets 171; Potassium 5.0; Sodium 139   Lipid Panel    Component Value Date/Time   CHOL 204 (H) 11/07/2015 0252   TRIG 30 11/07/2015 0252   HDL 59 11/07/2015 0252   CHOLHDL 3.5 11/07/2015 0252   VLDL 6 11/07/2015 0252   LDLCALC 139 (H) 11/07/2015 0252    Additional studies/ records that were reviewed today include: Summarized above.    ASSESSMENT & PLAN:   1. CAD - nononbstructive, not felt to be related to his syncope. Continue risk factor modification. He was placed on ASA by Dr. Radford Pax. He will continue statin. No BB given underlying sinus bradycardia (did not have MI).  2. History of DVT - see above re: Eliquis. Per prior notes, this is planned for 6 months. Instructed him to follow up with primary care provider to manage. 3. Syncope - etiology remains unclear. Event monitor per Dr. Rayann Harper unremarkable. Monitor for recurrence. If he does have another episode may need to consider ILR. 4. Hyperlipidemia - he wanted to know if he could come off Lipitor. I explained the rationale with his underlying CAD. He has not had any side effects. Will continue to monitor. F/u lipids/LFTs when he returns for his OV on 12/08/15. His appointment is not until 2 and I told him he can eat  a light breakfast that day (is an early bird).  Disposition: F/u with Dr. Rayann Harper as scheduled 12/08/15. I offered to push this out for him but he prefers to keep as planned as he has already made arrangements in his schedule.  Medication Adjustments/Labs and Tests Ordered: Current medicines are reviewed at length with the patient today.  Concerns regarding medicines are outlined above. Medication changes, Labs and Tests ordered today are summarized above and listed in the Patient Instructions accessible in Encounters.   Raechel Ache PA-C  11/21/2015 8:15 AM    [address], [place], [place]  [address] Phone: [phone]; Fax: [phone]

## 2015-11-21 ENCOUNTER — Ambulatory Visit (INDEPENDENT_AMBULATORY_CARE_PROVIDER_SITE_OTHER): Payer: PRIVATE HEALTH INSURANCE | Admitting: Physician Assistant

## 2015-11-21 ENCOUNTER — Encounter: Payer: Self-pay | Admitting: Physician Assistant

## 2015-11-21 VITALS — BP 112/82 | HR 59 | Ht 68.5 in | Wt 187.8 lb

## 2015-11-21 DIAGNOSIS — E785 Hyperlipidemia, unspecified: Secondary | ICD-10-CM | POA: Diagnosis not present

## 2015-11-21 DIAGNOSIS — I2510 Atherosclerotic heart disease of native coronary artery without angina pectoris: Secondary | ICD-10-CM | POA: Diagnosis not present

## 2015-11-21 NOTE — Patient Instructions (Signed)
Your physician recommends that you continue on your current medications as directed. Please refer to the Current Medication list given to you today.  Your physician recommends that you return for lab work on October 2 (lipids, liver function)  Your physician recommends that you keep your follow-up appointment with Dr. Rayann Heman.

## 2015-12-07 ENCOUNTER — Encounter: Payer: Self-pay | Admitting: Internal Medicine

## 2015-12-08 ENCOUNTER — Encounter: Payer: Self-pay | Admitting: Internal Medicine

## 2015-12-08 ENCOUNTER — Ambulatory Visit (INDEPENDENT_AMBULATORY_CARE_PROVIDER_SITE_OTHER): Payer: PRIVATE HEALTH INSURANCE | Admitting: Internal Medicine

## 2015-12-08 ENCOUNTER — Other Ambulatory Visit: Payer: PRIVATE HEALTH INSURANCE | Admitting: *Deleted

## 2015-12-08 VITALS — BP 108/70 | HR 59 | Ht 68.5 in | Wt 184.2 lb

## 2015-12-08 DIAGNOSIS — E785 Hyperlipidemia, unspecified: Secondary | ICD-10-CM

## 2015-12-08 DIAGNOSIS — R55 Syncope and collapse: Secondary | ICD-10-CM | POA: Diagnosis not present

## 2015-12-08 DIAGNOSIS — Z86718 Personal history of other venous thrombosis and embolism: Secondary | ICD-10-CM | POA: Diagnosis not present

## 2015-12-08 DIAGNOSIS — I2510 Atherosclerotic heart disease of native coronary artery without angina pectoris: Secondary | ICD-10-CM

## 2015-12-08 NOTE — Patient Instructions (Signed)
Medication Instructions:  Your physician recommends that you continue on your current medications as directed. Please refer to the Current Medication list given to you today.   Labwork: None ordered   Testing/Procedures: None ordered   Follow-Up: Your physician wants you to follow-up : As needed with Dr. Allred   Any Other Special Instructions Will Be Listed Below (If Applicable).     If you need a refill on your cardiac medications before your next appointment, please call your pharmacy.   

## 2015-12-08 NOTE — Progress Notes (Signed)
Electrophysiology Office Note   Date:  12/08/2015   ID:  Dwayne Harper, DOB March 19, 1945, MRN [id number]  PCP:  Irven Shelling, MD  Cardiologist:  none Primary Electrophysiologist: Thompson Grayer, MD    Chief Complaint  Patient presents with  . Loss of Consciousness     History of Present Illness: Dwayne Harper is a 70 y.o. male who presents today for electrophysiology follow-up.  He recently underwent cath which revealed nonobstructive CAD.  He has done well since.  Today, he denies symptoms of palpitations, chest pain, shortness of breath at rest, orthopnea, PND, lower extremity edema, claudication, dizziness, presyncope, bleeding, or neurologic sequela.   He has had no further syncope. The patient is tolerating medications without difficulties and is otherwise without complaint today.    Past Medical History:  Diagnosis Date  . Asthma   . Barrett esophagus   . BPH (benign prostatic hyperplasia)   . CAD in native artery    a. Abnl ETT-> LHC 11/07/15 - 65% prox-mid Cx, 70% OM1, 40% mid Cx, 30% ostial-mid LAD, 60% ostial D1, 20% prox-mid RCA.  Marland Kitchen DVT (deep venous thrombosis) ([place]) 09/2015   LLE  . ED (erectile dysfunction)   . GERD (gastroesophageal reflux disease)   . Hiatal hernia    endoscopy   . History of gout   . Hyperlipidemia 11/08/2015  . Pneumonia 1950s  . Scalp hematoma   . Syncope 09/26/2015  . Testicular cancer [facility])    orchiectomy  . Tubular adenoma    Past Surgical History:  Procedure Laterality Date  . CARDIAC CATHETERIZATION N/A 11/07/2015   Procedure: Left Heart Cath and Coronary Angiography;  Surgeon: Burnell Blanks, MD;  Location: [place] [facility];  Service: Cardiovascular;  Laterality: N/A;  . COLONOSCOPY  10/2013   tubular adenoma  . TESTICLE REMOVAL Left 1997   cancer  . TONSILLECTOMY    . WISDOM TOOTH EXTRACTION       Current Outpatient Prescriptions  Medication Sig Dispense Refill  . apixaban (ELIQUIS) 5 MG TABS tablet Take  5 mg by mouth 2 (two) times daily.    Marland Kitchen aspirin EC 81 MG EC tablet Take 1 tablet (81 mg total) by mouth daily. 30 tablet 6  . atorvastatin (LIPITOR) 20 MG tablet Take 1 tablet (20 mg total) by mouth every evening. 30 tablet 6  . Coenzyme Q10 (CO Q 10 PO) Take 1 tablet by mouth at bedtime.     . Cyanocobalamin (VITAMIN B-12 PO) Take 1 tablet by mouth at bedtime.     Marland Kitchen glucosamine-chondroitin 500-400 MG tablet Take 1 tablet by mouth at bedtime.     . Multiple Vitamin (MULTIVITAMIN) tablet Take 1 tablet by mouth at bedtime.     . nitroGLYCERIN (NITROSTAT) 0.4 MG SL tablet Place 1 tablet (0.4 mg total) under the tongue every 5 (five) minutes as needed for chest pain (up to 3 doses). 25 tablet 3  . Omega-3 Fatty Acids (FISH OIL) 1000 MG CAPS Take 1 capsule by mouth at bedtime.     Marland Kitchen omeprazole (PRILOSEC) 20 MG capsule Take 20 mg by mouth at bedtime.     Marland Kitchen OVER THE COUNTER MEDICATION Take 1 tablet by mouth at bedtime. Focus Factor    . sildenafil (VIAGRA) 100 MG tablet as directed.    . tamsulosin (FLOMAX) 0.4 MG CAPS capsule Take 0.4 mg by mouth at bedtime.      No current facility-administered medications for this visit.     Allergies:  Lactose intolerance (gi); Peanut-containing drug products; and Penicillins   Social History:  The patient  reports that he has quit smoking. His smoking use included Cigarettes. He quit after 3.00 years of use. He has never used smokeless tobacco. He reports that he drinks about 1.8 oz of alcohol per week . He reports that he does not use drugs.   Family History:  The patient's  family history includes Aneurysm in his father; Atrial fibrillation in his mother; CAD (age of onset: 54) in his mother; Coronary artery disease in his father; Deep vein thrombosis in his daughter; Deep vein thrombosis (age of onset: 35) in his father; Dementia in his mother; Heart attack in his mother; Heart disease in his mother; Hypertension in his father and mother; Prostate cancer in  his father.   Mother had CHF but lived to age 64.  No siblings.     ROS:  Please see the history of present illness.   All other systems are reviewed and negative.    PHYSICAL EXAM: VS:  BP 108/70   Pulse (!) 59   Ht 5' 8.5" (1.74 m)   Wt 184 lb 3.2 oz (83.6 kg)   BMI 27.60 kg/m  , BMI Body mass index is 27.6 kg/m. GEN: Well nourished, well developed, in no acute distress  HEENT: normal  Neck: no JVD, carotid bruits, or masses Cardiac: RRR; no murmurs, rubs, or gallops,no edema  Respiratory:  clear to auscultation bilaterally, normal work of breathing GI: soft, nontender, nondistended, + BS MS: no deformity or atrophy  Skin: warm and dry  Neuro:  Strength and sensation are intact Psych: euthymic mood, full affect Carotid massage maneuver normal today   Recent Labs: 11/07/2015: ALT 20 11/08/2015: BUN 13; Creatinine, Ser 0.97; Hemoglobin 14.4; Platelets 171; Potassium 5.0; Sodium 139    Lipid Panel     Component Value Date/Time   CHOL 204 (H) 11/07/2015 0252   TRIG 30 11/07/2015 0252   HDL 59 11/07/2015 0252   CHOLHDL 3.5 11/07/2015 0252   VLDL 6 11/07/2015 0252   LDLCALC 139 (H) 11/07/2015 0252     Wt Readings from Last 3 Encounters:  12/08/15 184 lb 3.2 oz (83.6 kg)  11/21/15 187 lb 12.8 oz (85.2 kg)  11/08/15 183 lb 6.4 oz (83.2 kg)     ASSESSMENT AND PLAN:  1.  Syncope Unclear etiology ekg and echo are benign Cath without obstruction No FH of arrhythmias Event monitor was normal. If he has further syncope, consider ILR implant at that time if etiology remains unclear.  Follow-up as needed No driving x 6 months (pt aware)  2. DVT On eliquis x 6 months PCP to follow  3. CAD Nonobstructive CAD On ASA and statin Lifestyle modification discussed today  Follow-up with PCP I will see as needed going forward   Signed, Thompson Grayer, MD  12/08/2015 8:03 PM     [address] [place] [address] [phone]  (office) [phone] (fax)

## 2015-12-09 LAB — LIPID PANEL
Cholesterol: 143 mg/dL (ref 125–200)
HDL: 63 mg/dL (ref >=40)
LDL Cholesterol: 69 mg/dL (ref <130)
Total CHOL/HDL Ratio: 2.3 Ratio (ref <=5.0)
Triglycerides: 56 mg/dL (ref <150)
VLDL: 11 mg/dL (ref <30)

## 2015-12-09 LAB — HEPATIC FUNCTION PANEL
ALBUMIN: 3.9 g/dL (ref 3.6–5.1)
ALT: 20 U/L (ref 9–46)
AST: 24 U/L (ref 10–35)
Alkaline Phosphatase: 46 U/L (ref 40–115)
Bilirubin, Direct: 0.1 mg/dL (ref <=0.2)
Indirect Bilirubin: 0.5 mg/dL (ref 0.2–1.2)
Total Bilirubin: 0.6 mg/dL (ref 0.2–1.2)
Total Protein: 6.4 g/dL (ref 6.1–8.1)

## 2015-12-10 ENCOUNTER — Telehealth: Payer: Self-pay | Admitting: Internal Medicine

## 2015-12-10 NOTE — Telephone Encounter (Signed)
New message    Pt returning nurse call. Pt states he will be boarding a flight at 11:20 and won't be available until 3pm. Please call.

## 2015-12-11 NOTE — Telephone Encounter (Signed)
Returned pts call and we discussed his lab results and he verbalized appreciation and understanding.

## 2015-12-11 NOTE — Telephone Encounter (Signed)
-----   Message from Charlie Pitter, [place] sent at 12/10/2015  7:51 AM EDT ----- Please let patient know labs look great on current cholesterol regimen. LDL much improved and now at goal. Melina Copa PA-C

## 2016-06-13 ENCOUNTER — Other Ambulatory Visit: Payer: Self-pay | Admitting: Physician Assistant

## 2016-10-06 ENCOUNTER — Encounter: Payer: Self-pay | Admitting: Neurology

## 2016-12-03 NOTE — Progress Notes (Signed)
Dwayne Harper was seen today in neurologic consultation at the request of Lavone Orn, MD.  The consultation is for the evaluation of facial twitching.  The patient has never noted it.  It started about a year ago and he doesn't know how often it happens.  It involves a twitching near upper lip and cheek.  It doesn't involve the eye.  Wife has noted it.  He attributes it to work stress.       Neuroimaging has  previously been performed.  It is available for my review today.  MRI of the brain without gad was performed on 09/27/2015 after a syncopal episode.   States that he was in garage (it was hot) and he fell back and he hit head and had 14 staples in head.  He was in hospital for days.   I had opportunities to review these images.  There were a few scattered T2 hyperintensities.  It was otherwise unremarkable.  PREVIOUS MEDICATIONS: n/a  ALLERGIES:   Allergies  Allergen Reactions  . Lactose Intolerance (Gi)     unknown  . Peanut-Containing Drug Products Nausea And Vomiting  . Penicillins Other (See Comments)    Unknown reaction in childhood Has patient had a PCN reaction causing immediate rash, facial/tongue/throat swelling, SOB or lightheadedness with hypotension: unknown Has patient had a PCN reaction causing severe rash involving mucus membranes or skin necrosis: unknown Has patient had a PCN reaction that required hospitalization: unknown Has patient had a PCN reaction occurring within the last 10 years: No If all of the above answers are "NO", then may proceed with Cephalosporin use.      CURRENT MEDICATIONS:  Outpatient Encounter Prescriptions as of 12/07/2016  Medication Sig  . aspirin EC 81 MG EC tablet Take 1 tablet (81 mg total) by mouth daily.  Marland Kitchen atorvastatin (LIPITOR) 20 MG tablet TAKE 1 TABLET (20 MG TOTAL) BY MOUTH EVERY EVENING.  . Coenzyme Q10 (CO Q 10 PO) Take 1 tablet by mouth at bedtime.   . Cyanocobalamin (VITAMIN B-12 PO) Take 1 tablet by mouth at bedtime.    Marland Kitchen glucosamine-chondroitin 500-400 MG tablet Take 1 tablet by mouth at bedtime.   . Multiple Vitamin (MULTIVITAMIN) tablet Take 1 tablet by mouth at bedtime.   . Omega-3 Fatty Acids (FISH OIL) 1000 MG CAPS Take 1 capsule by mouth at bedtime.   Marland Kitchen omeprazole (PRILOSEC) 20 MG capsule Take 20 mg by mouth at bedtime.   Marland Kitchen OVER THE COUNTER MEDICATION Take 1 tablet by mouth at bedtime. Focus Factor  . sildenafil (VIAGRA) 100 MG tablet as directed.  . solifenacin (VESICARE) 10 MG tablet Take 10 mg by mouth.  . tamsulosin (FLOMAX) 0.4 MG CAPS capsule Take 0.4 mg by mouth at bedtime.   Marland Kitchen apixaban (ELIQUIS) 5 MG TABS tablet Take 5 mg by mouth 2 (two) times daily.  . nitroGLYCERIN (NITROSTAT) 0.4 MG SL tablet Place 1 tablet (0.4 mg total) under the tongue every 5 (five) minutes as needed for chest pain (up to 3 doses). (Patient not taking: Reported on 12/07/2016)   No facility-administered encounter medications on file as of 12/07/2016.     PAST MEDICAL HISTORY:   Past Medical History:  Diagnosis Date  . Asthma   . Barrett esophagus   . BPH (benign prostatic hyperplasia)   . CAD in native artery    a. Abnl ETT-> LHC 11/07/15 - 65% prox-mid Cx, 70% OM1, 40% mid Cx, 30% ostial-mid LAD, 60% ostial D1, 20% prox-mid RCA.  Marland Kitchen  DVT (deep venous thrombosis) ([place]) 09/2015   LLE  . ED (erectile dysfunction)   . GERD (gastroesophageal reflux disease)   . Hiatal hernia    endoscopy   . History of gout   . Hyperlipidemia 11/08/2015  . Pneumonia 1950s  . Scalp hematoma   . Syncope 09/26/2015  . Testicular cancer [facility]) 1997   orchiectomy  . Tubular adenoma     PAST SURGICAL HISTORY:   Past Surgical History:  Procedure Laterality Date  . CARDIAC CATHETERIZATION N/A 11/07/2015   Procedure: Left Heart Cath and Coronary Angiography;  Surgeon: Burnell Blanks, MD;  Location: [place] [facility];  Service: Cardiovascular;  Laterality: N/A;  . CATARACT EXTRACTION, BILATERAL    . COLONOSCOPY  10/2013    tubular adenoma  . TESTICLE REMOVAL Left 1997   cancer  . TONSILLECTOMY    . WISDOM TOOTH EXTRACTION      SOCIAL HISTORY:   Social History   Social History  . Marital status: Married    Spouse name: N/A  . Number of children: N/A  . Years of education: N/A   Occupational History  . CFO     [employer]   Social History Main Topics  . Smoking status: Former Smoker    Years: 3.00    Types: Cigarettes    Quit date: 12/08/1971  . Smokeless tobacco: Never Used     Comment: "quit before 1975"  . Alcohol use 1.8 oz/week    2 Glasses of wine, 1 Cans of beer per week     Comment: 2-3 drinks/week  . Drug use: No  . Sexual activity: Not Currently   Other Topics Concern  . Not on file   Social History Narrative   Pt lives in [place] with spouse.  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.  Wife is endowed chair at [employer] in [employer].    FAMILY HISTORY:   Family Status  Relation Status  . Mother Deceased  . Father Deceased  . Daughter Alive  . Son Alive    ROS:  Occasional DOE.  A complete 10 system review of systems was obtained and was unremarkable apart from what is mentioned above.  PHYSICAL EXAMINATION:    VITALS:   Vitals:   12/07/16 0825  BP: 138/66  Pulse: 74  SpO2: 92%  Weight: 185 lb (83.9 kg)  Height: 5\' 8"  (1.727 m)    GEN:  Normal appears male in no acute distress.  Appears stated age. HEENT:  Normocephalic, atraumatic. The mucous membranes are moist. The superficial temporal arteries are without ropiness or tenderness. Cardiovascular: Regular rate and rhythm. Lungs: Clear to auscultation bilaterally. Neck/Heme: There are no carotid bruits noted bilaterally.  NEUROLOGICAL: Orientation:  The patient is alert and oriented x 3.  Fund of knowledge is appropriate.  Recent and remote memory intact.  Attention span and concentration normal.  Repeats and names without difficulty. Cranial nerves: There is good facial symmetry. The  pupils are equal round and reactive to light bilaterally. Fundoscopic exam reveals clear disc margins bilaterally. Extraocular muscles are intact and visual fields are full to confrontational testing. Speech is fluent and clear. Soft palate rises symmetrically and there is no tongue deviation. Hearing is intact to conversational tone. Tone: Tone is good throughout. Sensation: Sensation is intact to light touch and pinprick throughout (facial, trunk, extremities). Vibration is intact at the bilateral big toe. There is no extinction with double simultaneous stimulation. There is no sensory  dermatomal level identified. Coordination:  The patient has no difficulty with RAM's or FNF bilaterally. Motor: Strength is 5/5 in the bilateral upper and lower extremities.  Shoulder shrug is equal and symmetric. There is no pronator drift.  There are no fasciculations noted. DTR's: Deep tendon reflexes are 2/4 at the bilateral biceps, triceps, brachioradialis, patella and achilles.  Plantar responses are downgoing bilaterally. Gait and Station: The patient is able to ambulate without difficulty. The patient is able to heel toe walk without any difficulty. The patient is able to ambulate in a tandem fashion. The patient is able to stand in the Romberg position. Abnormal movements: Even with activation of muscles of facial expression, no twitching is noted.  IMPRESSION/PLAN  1.  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  This suspicious based purely on history.  He had talked about the probable diagnosis and its pathophysiology.  I did recommend an MRI of the brain with and without gadolinium to make sure nothing was sitting on the facial nerve.  He opted to hold on that.  He will certainly let me know if things get worse.  He states that he has just had lab work within the last 2 months, including chemistry and TSH.  He and I talked about treatments, including Botox, if things were to  get much worse.  At this point in time, I did not even notice it on examination and he does not notice that even though his wife does intermittently notice it.  He will pay attention to it, and if it gets worse he will call me for an appointment and further evaluation.   Cc:  Lavone Orn, MD

## 2016-12-07 ENCOUNTER — Encounter: Payer: Self-pay | Admitting: Neurology

## 2016-12-07 ENCOUNTER — Ambulatory Visit (INDEPENDENT_AMBULATORY_CARE_PROVIDER_SITE_OTHER): Payer: PRIVATE HEALTH INSURANCE | Admitting: Neurology

## 2016-12-07 VITALS — BP 138/66 | HR 74 | Ht 68.0 in | Wt 185.0 lb

## 2016-12-07 DIAGNOSIS — G5139 Clonic hemifacial spasm, unspecified: Secondary | ICD-10-CM | POA: Diagnosis not present

## 2016-12-07 NOTE — Patient Instructions (Signed)
Good to see you!  This is likely hemifacial spasm.  I would recommend an MRI brain with and without contrast.  If it gets worse you need to let me know and call me.  Thanks!

## 2016-12-25 ENCOUNTER — Other Ambulatory Visit: Payer: Self-pay | Admitting: Physician Assistant

## 2017-01-07 ENCOUNTER — Ambulatory Visit: Payer: PRIVATE HEALTH INSURANCE | Admitting: Neurology

## 2017-01-22 ENCOUNTER — Other Ambulatory Visit: Payer: Self-pay | Admitting: Physician Assistant

## 2017-02-08 DIAGNOSIS — E291 Testicular hypofunction: Secondary | ICD-10-CM | POA: Insufficient documentation

## 2017-02-08 DIAGNOSIS — R350 Frequency of micturition: Secondary | ICD-10-CM | POA: Insufficient documentation

## 2017-06-21 IMAGING — CT CT HEAD W/O CM
4 series · 16 of 47 positions shown, 18 images · non-contrast
Comparison: None.

CLINICAL DATA: Pain following fall.  Transient memory loss of event

EXAM:
CT HEAD WITHOUT CONTRAST
TECHNIQUE: Contiguous axial images were obtained from the base of the skull
through the vertex without intravenous contrast.

[Series 2: head without · axial · non-contrast · 0.45mm/px · z∈[-61,+49]mm · 7 of 30 slices shown, 9 images]
[im 4/30  brain]
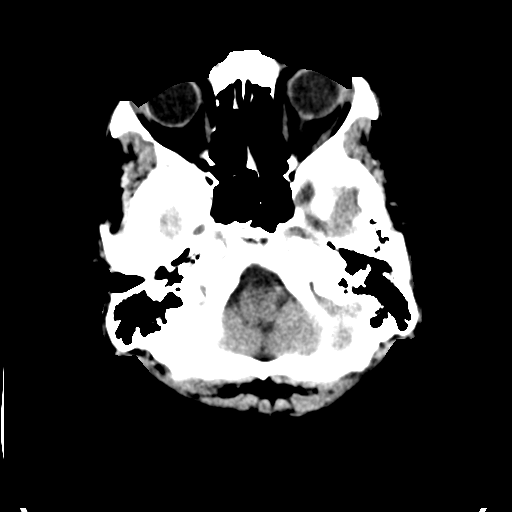
[im 4/30  bone]
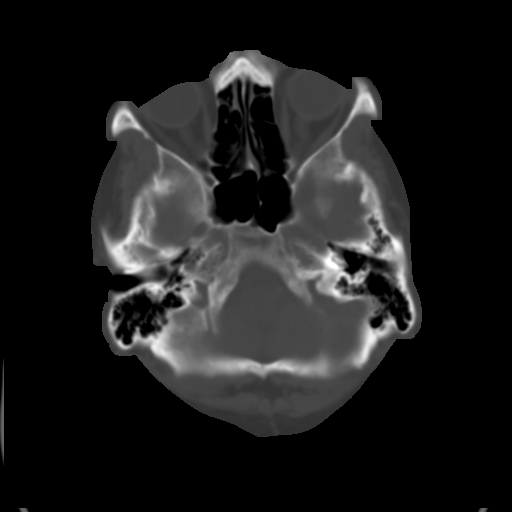
[im 8/30  brain]
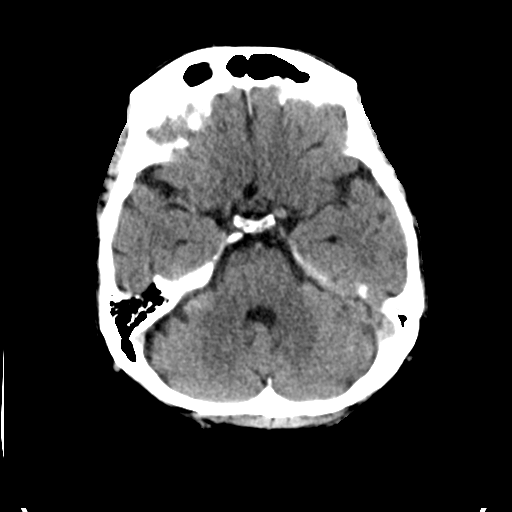
[im 11/30  brain]
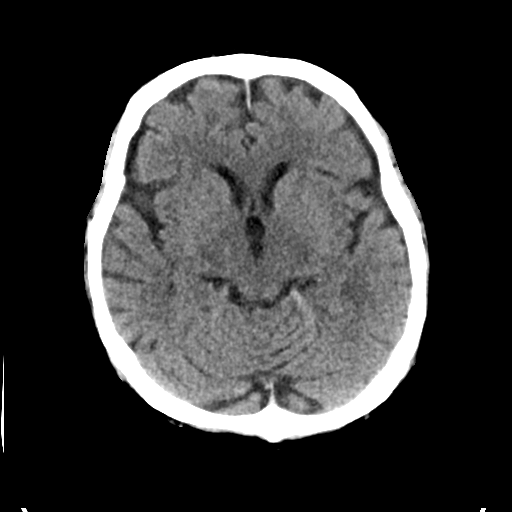
[im 15/30  brain]
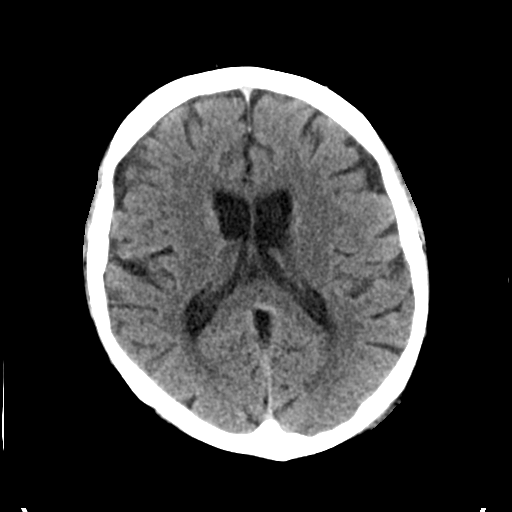
[im 19/30  brain]
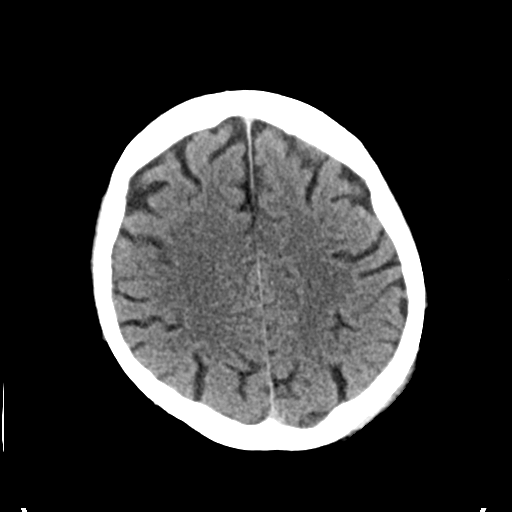
[im 19/30  bone]
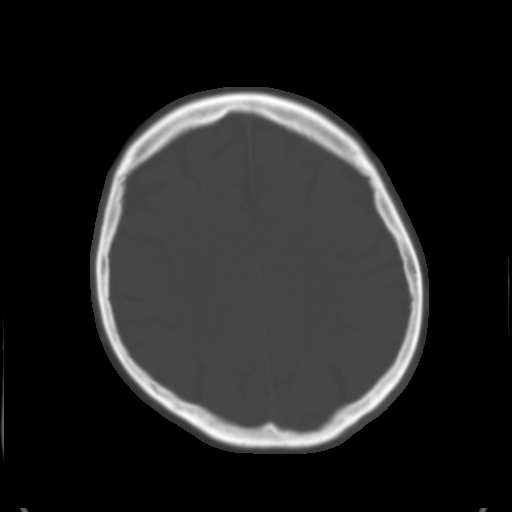
[im 22/30  brain]
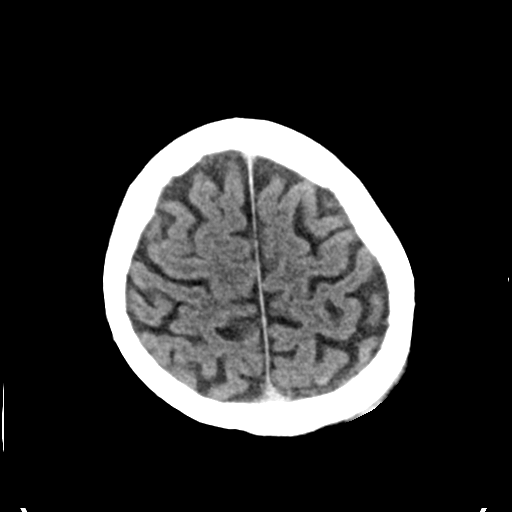
[im 26/30  brain]
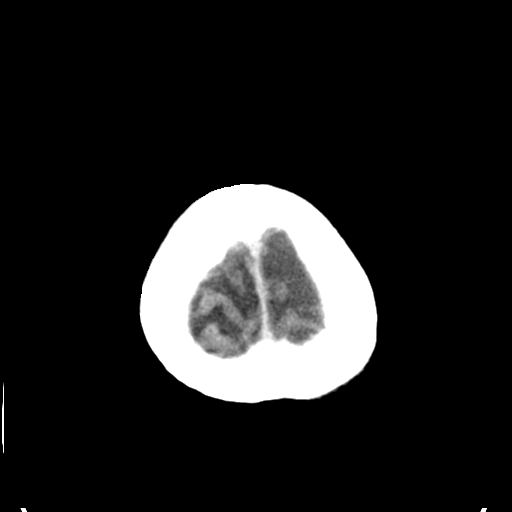

[Series 3: head bone · axial · 0.45mm/px · z∈[-62,-34]mm · 3 of 74 slices shown]
[im 8/74  bone]
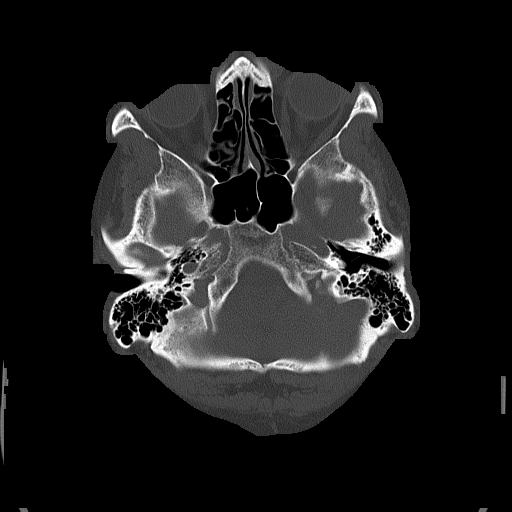
[im 15/74  bone]
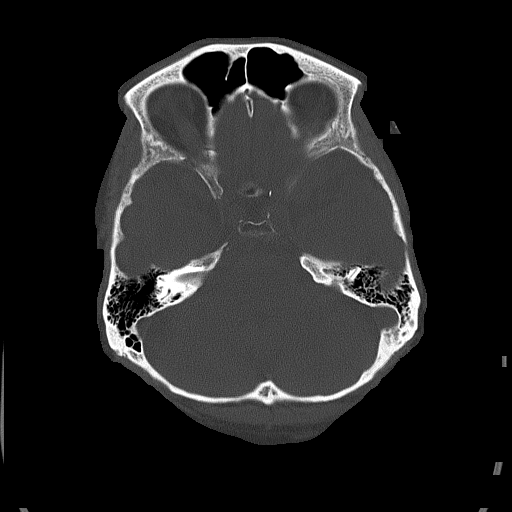
[im 22/74  bone]
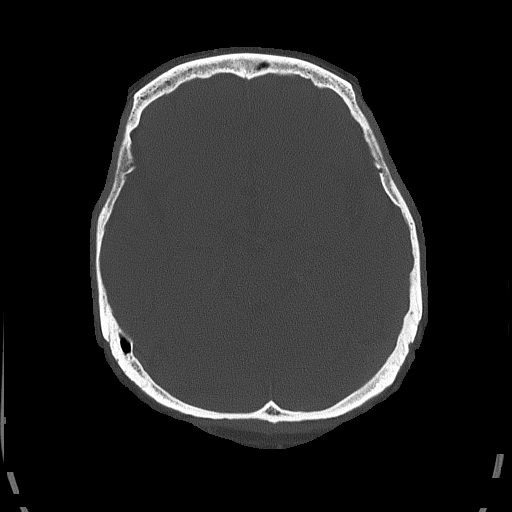

[Series 4: head without cor · coronal · non-contrast · 0.27mm/px · 3 of 61 slices shown]
[im 21/61  brain]
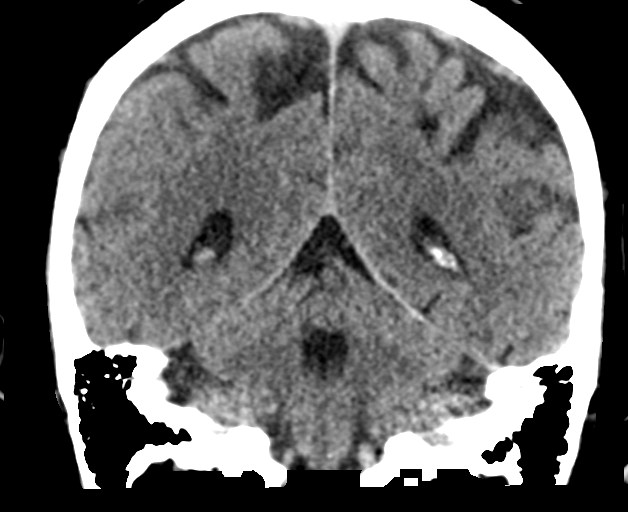
[im 27/61  brain]
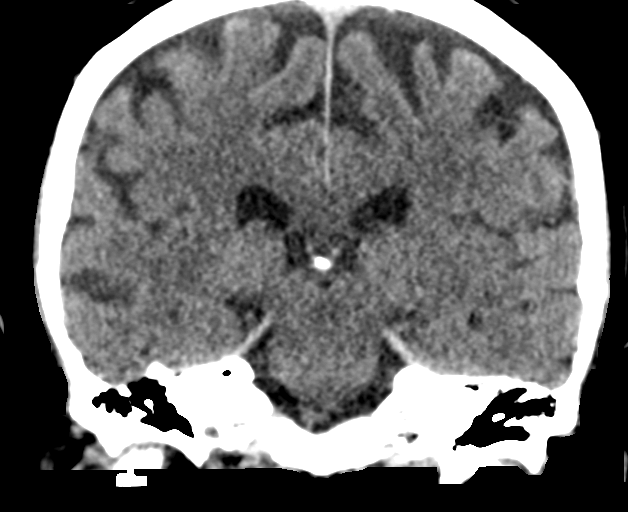
[im 34/61  brain]
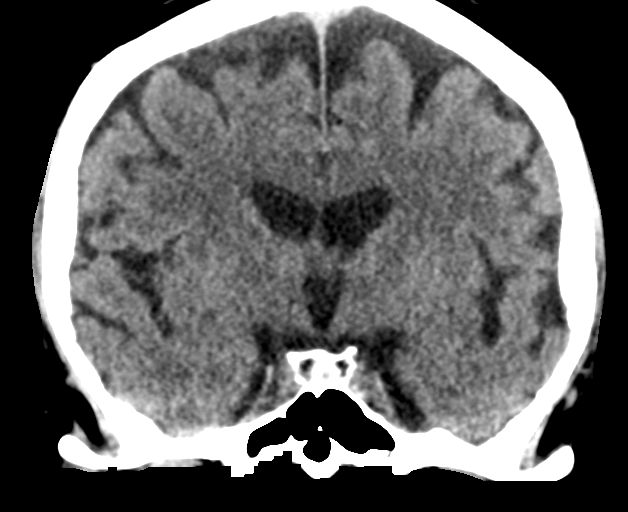

[Series 5: head without sag · sagittal · non-contrast · 0.32mm/px · 3 of 53 slices shown]
[im 18/53  brain]
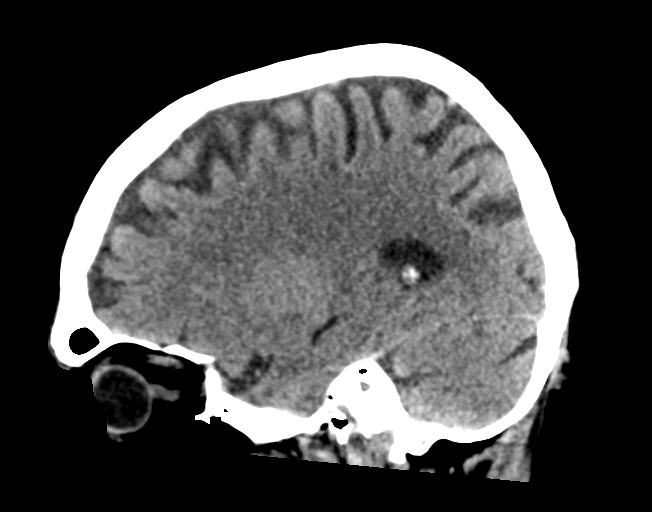
[im 27/53  brain]
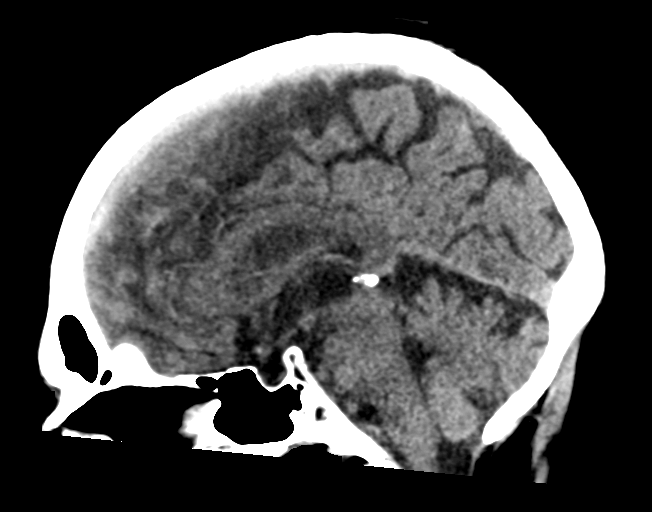
[im 35/53  brain]
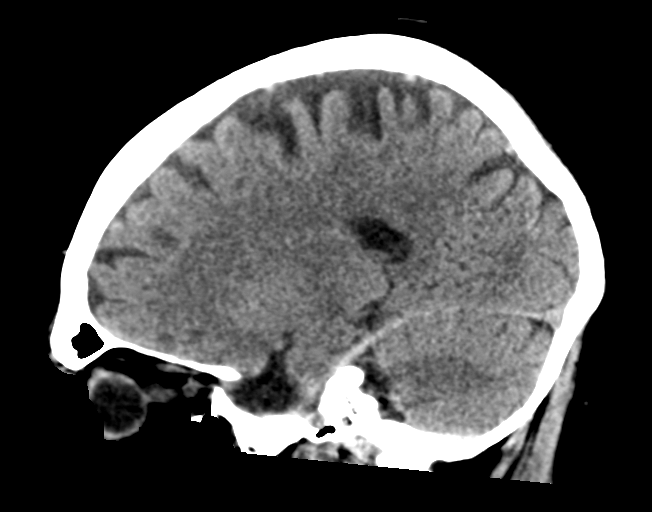

[16 of 47 positions shown; findings below may reference images not displayed]

FINDINGS: Brain: There is age related volume loss. There is no intracranial
mass, hemorrhage, extra-axial fluid collection, or midline shift.
The gray-white compartments appear normal. No acute infarct evident.

Vascular: There are foci of vascular calcification in left carotid
siphon and cavernous carotid artery regions on the left. There are
no hyperdense vessels.

Skull: The bony calvarium appears intact. There is a left parietal
scalp hematoma.

Sinuses/Orbits: Visualized orbits appear symmetric bilaterally.
Visualized paranasal sinuses are clear.

Other: Visualized mastoid air cells are clear.
IMPRESSION: Age related volume loss. No intracranial mass, hemorrhage, or
extra-axial fluid collection. Gray-white compartments appear normal.
Mild calcification in left carotid siphon and cavernous carotid
artery regions. There is a left parietal scalp hematoma.

## 2018-06-28 ENCOUNTER — Ambulatory Visit: Payer: No Typology Code available for payment source | Admitting: Podiatry

## 2018-06-29 ENCOUNTER — Other Ambulatory Visit: Payer: Self-pay

## 2018-06-29 ENCOUNTER — Encounter: Payer: Self-pay | Admitting: Podiatry

## 2018-06-29 ENCOUNTER — Ambulatory Visit: Payer: No Typology Code available for payment source | Admitting: Podiatry

## 2018-06-29 VITALS — Temp 97.9°F

## 2018-06-29 DIAGNOSIS — B351 Tinea unguium: Secondary | ICD-10-CM | POA: Diagnosis not present

## 2018-06-29 DIAGNOSIS — M722 Plantar fascial fibromatosis: Secondary | ICD-10-CM | POA: Diagnosis not present

## 2018-07-05 NOTE — Progress Notes (Signed)
Subjective:   Patient ID: Dwayne Harper, male   DOB: 73 y.o.   MRN: [id number]   HPI 73 year old male presents the office today requesting new orthotics.  His orthotics are several years old he appeared new wounds.  He originally gotten that he was getting pain in the bottoms of his feet presents wearing orthotics has not had any issues.  He also has been with his nails for concerns of possible fungus.  He said he had fungus several years ago.  He has no pain in the nails and denies any redness or drainage or any swelling to the toenail sites.  No recent injury to his feet.  He has no other concerns today.   Review of Systems  All other systems reviewed and are negative.  Past Medical History:  Diagnosis Date  . Asthma   . Barrett esophagus   . BPH (benign prostatic hyperplasia)   . CAD in native artery    a. Abnl ETT-> LHC 11/07/15 - 65% prox-mid Cx, 70% OM1, 40% mid Cx, 30% ostial-mid LAD, 60% ostial D1, 20% prox-mid RCA.  Marland Kitchen DVT (deep venous thrombosis) ([place]) 09/2015   LLE  . ED (erectile dysfunction)   . GERD (gastroesophageal reflux disease)   . Hiatal hernia    endoscopy   . History of gout   . Hyperlipidemia 11/08/2015  . Pneumonia 1950s  . Scalp hematoma   . Syncope 09/26/2015  . Testicular cancer [facility]) 1997   orchiectomy  . Tubular adenoma     Past Surgical History:  Procedure Laterality Date  . CARDIAC CATHETERIZATION N/A 11/07/2015   Procedure: Left Heart Cath and Coronary Angiography;  Surgeon: Burnell Blanks, MD;  Location: [place] [facility];  Service: Cardiovascular;  Laterality: N/A;  . CATARACT EXTRACTION, BILATERAL    . COLONOSCOPY  10/2013   tubular adenoma  . TESTICLE REMOVAL Left 1997   cancer  . TONSILLECTOMY    . WISDOM TOOTH EXTRACTION       Current Outpatient Medications:  .  aspirin EC 81 MG EC tablet, Take 1 tablet (81 mg total) by mouth daily., Disp: 30 tablet, Rfl: 6 .  atorvastatin (LIPITOR) 20 MG tablet, Take 1 tablet (20 mg total)  every evening by mouth. Please make overdue yearly appt with Dr. Rayann Heman before anymore refills. 2nd Attempt, Disp: 15 tablet, Rfl: 0 .  Coenzyme Q10 (CO Q 10 PO), Take 1 tablet by mouth at bedtime. , Disp: , Rfl:  .  Cyanocobalamin (VITAMIN B-12 PO), Take 1 tablet by mouth at bedtime. , Disp: , Rfl:  .  glucosamine-chondroitin 500-400 MG tablet, Take 1 tablet by mouth at bedtime. , Disp: , Rfl:  .  Multiple Vitamin (MULTIVITAMIN) tablet, Take 1 tablet by mouth at bedtime. , Disp: , Rfl:  .  Omega-3 Fatty Acids (FISH OIL) 1000 MG CAPS, Take 1 capsule by mouth at bedtime. , Disp: , Rfl:  .  omeprazole (PRILOSEC) 20 MG capsule, Take 20 mg by mouth at bedtime. , Disp: , Rfl:  .  OVER THE COUNTER MEDICATION, Take 1 tablet by mouth at bedtime. Focus Factor, Disp: , Rfl:  .  solifenacin (VESICARE) 10 MG tablet, Take 10 mg by mouth., Disp: , Rfl:  .  tamsulosin (FLOMAX) 0.4 MG CAPS capsule, Take 0.4 mg by mouth at bedtime. , Disp: , Rfl:   Allergies  Allergen Reactions  . Lactose Intolerance (Gi)     unknown  . Peanut-Containing Drug Products Nausea And Vomiting  . Penicillins  Other (See Comments)    Unknown reaction in childhood Has patient had a PCN reaction causing immediate rash, facial/tongue/throat swelling, SOB or lightheadedness with hypotension: unknown Has patient had a PCN reaction causing severe rash involving mucus membranes or skin necrosis: unknown Has patient had a PCN reaction that required hospitalization: unknown Has patient had a PCN reaction occurring within the last 10 years: No If all of the above answers are "NO", then may proceed with Cephalosporin use.             Objective:  Physical Exam  General: AAO x3, NAD  Dermatological: Nails are mildly hypertrophic, dystrophic with yellow-brown discoloration there is no pain in the nails there is no surrounding redness or drainage or any clinical signs of infection.  No open lesions.  Vascular: Dorsalis Pedis artery  and Posterior Tibial artery pedal pulses are 2/4 bilateral with immedate capillary fill time. There is no pain with calf compression, swelling, warmth, erythema.   Neruologic: Grossly intact via light touch bilateral. Protective threshold with Semmes Wienstein monofilament intact to all pedal sites bilateral.  Negative Tinel sign.  Musculoskeletal: At this time there is no area of pinpoint tenderness there is no pain vibratory sensation.  No edema, erythema.  Achilles tendon, myofascial.  Intact.  Not able to elicit any tenderness today however he was sleeping well with plantar fascial previously.  MMT 5/5  Gait: Unassisted, Nonantalgic.       Assessment:   History of plantar fasciitis requesting orthotics, onychomycosis     Plan:  -Treatment options discussed including all alternatives, risks, and complications -Etiology of symptoms were discussed -He was measured for new orthotics today for request.  Discussed general stretching as well as establishment reoccurrence of foot pain -Discussed nail fungus.  Using several over-the-counter Fungi-Nail we discussed a compound cream for nail fungus or other topical if he so chooses in the future.  He will start with over-the-counter first.  Trula Slade DPM

## 2018-08-07 ENCOUNTER — Other Ambulatory Visit: Payer: Self-pay

## 2018-08-07 ENCOUNTER — Ambulatory Visit: Payer: No Typology Code available for payment source | Admitting: Orthotics

## 2018-08-07 DIAGNOSIS — M722 Plantar fascial fibromatosis: Secondary | ICD-10-CM

## 2018-08-07 NOTE — Progress Notes (Signed)
Picked up f/o...everything fine with fit and function.

## 2019-08-19 ENCOUNTER — Emergency Department (HOSPITAL_COMMUNITY)
Admission: EM | Admit: 2019-08-19 | Discharge: 2019-08-20 | Disposition: A | Discharge status: Home / Self Care | Payer: PRIVATE HEALTH INSURANCE | Source: Home / Self Care | Attending: Emergency Medicine | Admitting: Emergency Medicine

## 2019-08-19 ENCOUNTER — Encounter (HOSPITAL_COMMUNITY): Payer: Self-pay | Admitting: Emergency Medicine

## 2019-08-19 ENCOUNTER — Other Ambulatory Visit: Payer: Self-pay

## 2019-08-19 DIAGNOSIS — K8020 Calculus of gallbladder without cholecystitis without obstruction: Secondary | ICD-10-CM

## 2019-08-19 DIAGNOSIS — R112 Nausea with vomiting, unspecified: Secondary | ICD-10-CM

## 2019-08-19 DIAGNOSIS — R1011 Right upper quadrant pain: Secondary | ICD-10-CM

## 2019-08-19 DIAGNOSIS — K810 Acute cholecystitis: Secondary | ICD-10-CM | POA: Diagnosis not present

## 2019-08-19 DIAGNOSIS — K8010 Calculus of gallbladder with chronic cholecystitis without obstruction: Secondary | ICD-10-CM | POA: Diagnosis not present

## 2019-08-19 LAB — CBC
HCT: 43.7 % (ref 39.0–52.0)
Hemoglobin: 14.9 g/dL (ref 13.0–17.0)
MCH: 31.8 pg (ref 26.0–34.0)
MCHC: 34.1 g/dL (ref 30.0–36.0)
MCV: 93.2 fL (ref 80.0–100.0)
Platelets: 158 10*3/uL (ref 150–400)
RBC: 4.69 MIL/uL (ref 4.22–5.81)
RDW: 12.2 % (ref 11.5–15.5)
WBC: 16.9 10*3/uL — ABNORMAL HIGH (ref 4.0–10.5)
nRBC: 0 % (ref 0.0–0.2)

## 2019-08-19 LAB — COMPREHENSIVE METABOLIC PANEL
ALT: 29 U/L (ref 0–44)
AST: 33 U/L (ref 15–41)
Albumin: 3.4 g/dL — ABNORMAL LOW (ref 3.5–5.0)
Alkaline Phosphatase: 52 U/L (ref 38–126)
Anion gap: 9 (ref 5–15)
BUN: 17 mg/dL (ref 8–23)
CO2: 24 mmol/L (ref 22–32)
Calcium: 8.8 mg/dL — ABNORMAL LOW (ref 8.9–10.3)
Chloride: 99 mmol/L (ref 98–111)
Creatinine, Ser: 1 mg/dL (ref 0.61–1.24)
GFR calc Af Amer: >60 mL/min (ref >60)
GFR calc non Af Amer: >60 mL/min (ref >60)
Glucose, Bld: 116 mg/dL — ABNORMAL HIGH (ref 70–99)
Potassium: 4 mmol/L (ref 3.5–5.1)
Sodium: 132 mmol/L — ABNORMAL LOW (ref 135–145)
Total Bilirubin: 1.3 mg/dL — ABNORMAL HIGH (ref 0.3–1.2)
Total Protein: 6.5 g/dL (ref 6.5–8.1)

## 2019-08-19 LAB — URINALYSIS, ROUTINE W REFLEX MICROSCOPIC
Bacteria, UA: NONE SEEN
Bilirubin Urine: NEGATIVE
Glucose, UA: NEGATIVE mg/dL
Ketones, ur: NEGATIVE mg/dL
Leukocytes,Ua: NEGATIVE
Nitrite: NEGATIVE
Protein, ur: NEGATIVE mg/dL
Specific Gravity, Urine: 1.012 (ref 1.005–1.030)
pH: 5 (ref 5.0–8.0)

## 2019-08-19 LAB — LIPASE, BLOOD: Lipase: 20 U/L (ref 11–51)

## 2019-08-19 MED ORDER — SODIUM CHLORIDE 0.9% FLUSH
3.0000 mL | Freq: Once | INTRAVENOUS | Status: AC
  Administered 2019-08-20: 3 mL via INTRAVENOUS

## 2019-08-19 NOTE — ED Triage Notes (Signed)
Patient reports multiple emesis yesterday and right abdominal pain today , no diarrhea or fever , patient suspects food poisoning .

## 2019-08-20 ENCOUNTER — Emergency Department (HOSPITAL_COMMUNITY): Payer: PRIVATE HEALTH INSURANCE

## 2019-08-20 MED ORDER — MORPHINE SULFATE (PF) 4 MG/ML IV SOLN
4.0000 mg | Freq: Once | INTRAVENOUS | Status: AC
  Administered 2019-08-20: 4 mg via INTRAVENOUS
  Filled 2019-08-20: qty 1

## 2019-08-20 MED ORDER — OXYCODONE-ACETAMINOPHEN 5-325 MG PO TABS
1.0000 | ORAL_TABLET | Freq: Once | ORAL | Status: AC
  Administered 2019-08-20: 1 via ORAL
  Filled 2019-08-20: qty 1

## 2019-08-20 MED ORDER — ONDANSETRON 4 MG PO TBDP
4.0000 mg | ORAL_TABLET | Freq: Three times a day (TID) | ORAL | 0 refills | Status: DC | PRN
Start: 2019-08-20 — End: 2021-10-21

## 2019-08-20 MED ORDER — ONDANSETRON HCL 4 MG/2ML IJ SOLN
4.0000 mg | Freq: Once | INTRAMUSCULAR | Status: AC
  Administered 2019-08-20: 4 mg via INTRAVENOUS
  Filled 2019-08-20: qty 2

## 2019-08-20 MED ORDER — IOHEXOL 300 MG/ML  SOLN
100.0000 mL | Freq: Once | INTRAMUSCULAR | Status: AC | PRN
  Administered 2019-08-20: 100 mL via INTRAVENOUS

## 2019-08-20 MED ORDER — OXYCODONE-ACETAMINOPHEN 5-325 MG PO TABS: 1.0000 | ORAL_TABLET | ORAL | 0 refills | Status: DC | PRN

## 2019-08-20 MED ORDER — SODIUM CHLORIDE 0.9 % IV BOLUS
1000.0000 mL | Freq: Once | INTRAVENOUS | Status: AC
  Administered 2019-08-20: 1000 mL via INTRAVENOUS

## 2019-08-20 NOTE — ED Notes (Signed)
Pt transported to CT at this time.

## 2019-08-20 NOTE — ED Notes (Signed)
Pt returned from CT °

## 2019-08-20 NOTE — ED Notes (Signed)
Pt taken to US

## 2019-08-20 NOTE — Discharge Instructions (Signed)
Take the prescribed medication as directed.  Recommend gentle diet for now, progress back to normal as tolerated. Follow-up with general surgery about the gallstones--- you can call this morning to schedule appt or have your primary care doctor facilitate this. Return to the ED for new or worsening symptoms.

## 2019-08-20 NOTE — ED Notes (Signed)
Patient verbalizes understanding of discharge instructions. Opportunity for questioning and answers were provided. Armband removed by staff, pt discharged from ED. Ambulated out to lobby  

## 2019-08-20 NOTE — ED Provider Notes (Signed)
[facility] EMERGENCY DEPARTMENT Provider Note   CSN: [id number] Arrival date & time: 08/19/19  2004     History Chief Complaint  Patient presents with  . Abdominal Pain    Dwayne Harper is a 74 y.o. male.  The history is provided by the patient and medical records.  Abdominal Pain Associated symptoms: nausea and vomiting    74 year old male with history of asthma, Barrett's esophagus, BPH, coronary artery disease, GERD, hyperlipidemia, history of testicular cancer, presenting to the ED for right-sided abdominal pain.  Patient reports he began vomiting last evening.  States it continued all throughout the night.  He was able to sleep a little, upon waking this morning he had right-sided abdominal pain.  This is been persistent all day.  He reports continued vomiting without fever or chills.  He was able to keep down some small sips of water and Gatorade.  He has not attempted to eat any solid food.  He did not take any home medications.  His wife thought he may have had food poisoning as he ate a [employer], she thought the [place] was spoiled.  Patient stated it tasted "a little funny" but nothing to make him stop eating the sandwich.  Past Medical History:  Diagnosis Date  . Asthma   . Barrett esophagus   . BPH (benign prostatic hyperplasia)   . CAD in native artery    a. Abnl ETT-> LHC 11/07/15 - 65% prox-mid Cx, 70% OM1, 40% mid Cx, 30% ostial-mid LAD, 60% ostial D1, 20% prox-mid RCA.  Marland Kitchen DVT (deep venous thrombosis) ([place]) 09/2015   LLE  . ED (erectile dysfunction)   . GERD (gastroesophageal reflux disease)   . Hiatal hernia    endoscopy   . History of gout   . Hyperlipidemia 11/08/2015  . Pneumonia 1950s  . Scalp hematoma   . Syncope 09/26/2015  . Testicular cancer [facility]) 1997   orchiectomy  . Tubular adenoma     Patient Active Problem List   Diagnosis Date Noted  . 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 Increased frequency of urination  02/08/2017  . CAD in native artery 11/08/2015  . Hyperlipidemia 11/08/2015  . Abnormal stress test 11/06/2015  . Abnormal nuclear stress test 11/06/2015  . History of DVT (deep vein thrombosis) 11/05/2015  . Syncope and collapse 09/26/2015  . Acute encephalopathy 09/26/2015  . Thrombocytopenia ([place]) 09/26/2015  . Scalp hematoma   . Concussion with no loss of consciousness   . BPH (benign prostatic hyperplasia) 09/11/2012  . ED (erectile dysfunction) of organic origin 09/11/2012  . Voiding dysfunction 08/25/2011  . COUGH 01/09/2009  . HEARTBURN 03/07/2008  . History of Hodgkin's disease 04/20/2007  . 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 CARDIAC CATHETERIZATION N/A 11/07/2015   Procedure: Left Heart Cath and Coronary Angiography;  Surgeon: Burnell Blanks, MD;  Location: [place] [facility];  Service: Cardiovascular;  Laterality: N/A;  . CATARACT EXTRACTION, BILATERAL    . COLONOSCOPY  10/2013   tubular adenoma  . TESTICLE REMOVAL Left 1997   cancer  . TONSILLECTOMY    . WISDOM TOOTH EXTRACTION         Family History  Problem Relation Age of Onset  . CAD Mother 67  . Heart attack Mother   . Atrial fibrillation Mother   . Hypertension Mother   . Heart disease Mother   . Dementia Mother   .  Coronary artery disease Father   . Aneurysm Father   . Hypertension Father   . Prostate cancer Father   . Deep vein thrombosis Father 43       IVC filter  . Deep vein thrombosis Daughter        during pregnancy    Social History   Tobacco Use  . Smoking status: Former Smoker    Years: 3.00    Types: Cigarettes    Quit date: 12/08/1971    Years since quitting: 47.7  . Smokeless tobacco: Never Used  . Tobacco comment: "quit before 1975"  Substance Use Topics  . Alcohol use: Yes    Alcohol/week: 3.0 standard drinks    Types: 2 Glasses of wine, 1 Cans of beer per week    Comment: 2-3 drinks/week  . Drug  use: No    Home Medications Prior to Admission medications   Medication Sig Start Date End Date Taking? Authorizing Provider  aspirin EC 81 MG EC tablet Take 1 tablet (81 mg total) by mouth daily. 11/08/15   Dunn, Nedra Hai, PA-C  atorvastatin (LIPITOR) 20 MG tablet Take 1 tablet (20 mg total) every evening by mouth. Please make overdue yearly appt with Dr. Rayann Heman before anymore refills. 2nd Attempt 01/24/17   Charlie Pitter, PA-C  Coenzyme Q10 (CO Q 10 PO) Take 1 tablet by mouth at bedtime.     [provider]  Cyanocobalamin (VITAMIN B-12 PO) Take 1 tablet by mouth at bedtime.     [provider]  glucosamine-chondroitin 500-400 MG tablet Take 1 tablet by mouth at bedtime.     [provider]  Multiple Vitamin (MULTIVITAMIN) tablet Take 1 tablet by mouth at bedtime.     [provider]  Omega-3 Fatty Acids (FISH OIL) 1000 MG CAPS Take 1 capsule by mouth at bedtime.     [provider]  omeprazole (PRILOSEC) 20 MG capsule Take 20 mg by mouth at bedtime.     [provider]  OVER THE COUNTER MEDICATION Take 1 tablet by mouth at bedtime. Focus Factor    [provider]  solifenacin (VESICARE) 10 MG tablet Take 10 mg by mouth. 11/02/16   [provider]  tamsulosin (FLOMAX) 0.4 MG CAPS capsule Take 0.4 mg by mouth at bedtime.     [provider]    Allergies    Lactose intolerance (gi), Peanut-containing drug products, and Penicillins  Review of Systems   Review of Systems  Gastrointestinal: Positive for abdominal pain, nausea and vomiting.  All other systems reviewed and are negative.   Physical Exam Updated Vital Signs BP 135/73 (BP Location: Right Arm)   Pulse 70   Temp 98.6 F (37 C) (Oral)   Resp 18   Ht 5\' 8"  (1.727 m)   Wt 85 kg   SpO2 97%   BMI 28.49 kg/m   Physical Exam Vitals and nursing note reviewed.  Constitutional:      Appearance: He is well-developed.  HENT:     Head: Normocephalic  and atraumatic.  Eyes:     Conjunctiva/sclera: Conjunctivae normal.     Pupils: Pupils are equal, round, and reactive to light.  Cardiovascular:     Rate and Rhythm: Normal rate and regular rhythm.     Heart sounds: Normal heart sounds.  Pulmonary:     Effort: Pulmonary effort is normal.     Breath sounds: Normal breath sounds.  Abdominal:     General: Bowel sounds are normal.  Palpations: Abdomen is soft.     Tenderness: There is abdominal tenderness in the right lower quadrant. There is no rebound.    Musculoskeletal:        General: Normal range of motion.     Cervical back: Normal range of motion.  Skin:    General: Skin is warm and dry.  Neurological:     Mental Status: He is alert and oriented to person, place, and time.     ED Results / Procedures / Treatments   Labs (all labs ordered are listed, but only abnormal results are displayed) Labs Reviewed  COMPREHENSIVE METABOLIC PANEL - Abnormal; Notable for the following components:      Result Value   Sodium 132 (*)    Glucose, Bld 116 (*)    Calcium 8.8 (*)    Albumin 3.4 (*)    Total Bilirubin 1.3 (*)    All other components within normal limits  CBC - Abnormal; Notable for the following components:   WBC 16.9 (*)    All other components within normal limits  URINALYSIS, ROUTINE W REFLEX MICROSCOPIC - Abnormal; Notable for the following components:   Hgb urine dipstick MODERATE (*)    All other components within normal limits  LIPASE, BLOOD    EKG None  Radiology CT ABDOMEN PELVIS W CONTRAST  Result Date: 08/20/2019 CLINICAL DATA:  Right lower quadrant pain, vomiting EXAM: CT ABDOMEN AND PELVIS WITH CONTRAST TECHNIQUE: Multidetector CT imaging of the abdomen and pelvis was performed using the standard protocol following bolus administration of intravenous contrast. CONTRAST:  153mL OMNIPAQUE IOHEXOL 300 MG/ML  SOLN COMPARISON:  None. FINDINGS: Lower chest: Right base atelectasis.  No effusions.  Hepatobiliary: Gallbladder is distended. 6 mm stone in the gallbladder neck. No focal hepatic abnormality. Pancreas: No focal abnormality or ductal dilatation. Spleen: No focal abnormality.  Normal size. Adrenals/Urinary Tract: No adrenal abnormality. No focal renal abnormality. No stones or hydronephrosis. Urinary bladder is unremarkable. Stomach/Bowel: 3 cm gas and fluid collection seen posterior to the pancreatic head, adjacent to the descending and transverse duodenum, presumably duodenal diverticulum. Colonic diverticula noted scattered throughout the colon. No evidence of diverticulitis. Stomach and small bowel decompressed. Vascular/Lymphatic: Aortic atherosclerosis. No enlarged abdominal or pelvic lymph nodes. Reproductive: No visible focal abnormality. Other: No free fluid or free air. Musculoskeletal: 2 no acute2 bony abnormality. IMPRESSION: Gallbladder is distended with 6 mm stone in the region of the gallbladder neck. Suggestion of slight inflammation adjacent to the gallbladder concerning for possible cholecystitis. This could be further evaluated with right upper quadrant ultrasound if felt clinically indicated. Scattered colonic diverticulosis.  No active diverticulitis. Appendix not definitively seen.  No pericecal inflammation. Aortic atherosclerosis. Electronically Signed   By: Rolm Baptise M.D.   On: 08/20/2019 03:02   US Abdomen Limited RUQ  Result Date: 08/20/2019 CLINICAL DATA:  Right upper quadrant pain EXAM: ULTRASOUND ABDOMEN LIMITED RIGHT UPPER QUADRANT COMPARISON:  Abdominal CT from earlier today FINDINGS: Gallbladder: Gallbladder sludge and at least 1 shadowing calculus at the neck. The gallbladder is full but there is no wall thickening or pericholecystic edema. Negative sonographic Murphy sign. Common bile duct: Diameter: 2 mm Liver: Limited visualization due to positioning behind the ribs. No focal abnormality. Portal vein is patent on color Doppler imaging with normal direction  of blood flow towards the liver. IMPRESSION: Cholelithiasis and gallbladder sludge. The gallbladder is full but there is no definite inflammatory finding for cholecystitis. Electronically Signed   By: Neva Seat.D.  On: 08/20/2019 04:41    Procedures Procedures (including critical care time)  Medications Ordered in ED Medications  sodium chloride flush (NS) 0.9 % injection 3 mL (3 mLs Intravenous Given 08/20/19 0242)  sodium chloride 0.9 % bolus 1,000 mL (0 mLs Intravenous Stopped 08/20/19 0542)  ondansetron (ZOFRAN) injection 4 mg (4 mg Intravenous Given 08/20/19 0242)  morphine 4 MG/ML injection 4 mg (4 mg Intravenous Given 08/20/19 0242)  iohexol (OMNIPAQUE) 300 MG/ML solution 100 mL (100 mLs Intravenous Contrast Given 08/20/19 0250)  oxyCODONE-acetaminophen (PERCOCET/ROXICET) 5-325 MG per tablet 1 tablet (1 tablet Oral Given 08/20/19 0549)    ED Course  I have reviewed the triage vital signs and the nursing notes.  Pertinent labs & imaging results that were available during my care of the patient were reviewed by me and considered in my medical decision making (see chart for details).    MDM Rules/Calculators/A&P  74 year old male presenting to the ED with abdominal pain. States he began vomiting yesterday evening after eating a [employer] sandwich and thinks the [place] was spoiled. States it did taste weird but he finished the sandwich anyway. States he has been vomiting pretty much all night and had right-sided abdominal pain beginning today. He is afebrile and nontoxic in appearance. He does appear a little uncomfortable. He does have tenderness in the right lower abdomen on initial exam.  Normal bowel sounds.  Labs with leukocytosis, may be reactive from vomiting. Labs otherwise reassuring. Given his age, will obtain CT scan. Patient given IV fluids, morphine, and Zofran. Will reassess.  CT with distended gallbladder, stone noted in the gallbladder neck. There is suggestion of  inflammation adjacent to the gallbladder, questionable possible cholecystitis. Right upper quadrant ultrasound recommended. Appendix not definitively seen but there is no pericecal inflammation. Patient does appear more comfortable after pain medication, on reexamination he does have some tenderness in the RUQ with deep pressure.  Will obtain US.  5:20 AM Patient resting comfortably.  Once awoken, states he has very minimal pain, about 2-3/10.  He is drinking water currently without issue.  [place] with findings of cholelithiasis and gallbladder sludge without findings of acute cholecystitis.  Given dose of percocet.  Will monitor.  6:27 AM He has tolerated oral fluids and medications. Pain is well controlled.  Vitals are stable.  At this point without convincing imaging findings of acute cholecystitis or any lab abnormalities, feel he is stable for discharge.  We will refer to general surgery for close OP follow-up.  He was advised gentle/bland diet for now and progress back to normal as tolerated.  Rx percocet, zofran.  Return here for any new/acute changes.  Shared visit with attending physician, Dr. Betsey Holiday, who evaluated patient and agrees with work-up/management.  Final Clinical Impression(s) / ED Diagnoses Final diagnoses:  RUQ pain  Non-intractable vomiting with nausea, unspecified vomiting type  Calculus of gallbladder without cholecystitis without obstruction    Rx / DC Orders ED Discharge Orders         Ordered    oxyCODONE-acetaminophen (PERCOCET) 5-325 MG tablet  Every 4 hours PRN     Discontinue  Reprint     08/20/19 0617    ondansetron (ZOFRAN ODT) 4 MG disintegrating tablet  Every 8 hours PRN     Discontinue  Reprint     08/20/19 0617           Larene Pickett, PA-C 08/20/19 3151    Orpah Greek, MD 08/20/19 [id number]

## 2019-08-22 ENCOUNTER — Other Ambulatory Visit (HOSPITAL_COMMUNITY): Payer: Self-pay | Admitting: Surgery

## 2019-08-22 ENCOUNTER — Inpatient Hospital Stay (HOSPITAL_COMMUNITY)
Admission: RE | Admit: 2019-08-22 | Discharge: 2019-08-25 | DRG: 419 | Disposition: A | Discharge status: Home / Self Care | Payer: PRIVATE HEALTH INSURANCE | Source: Ambulatory Visit | Attending: Surgery | Admitting: Surgery

## 2019-08-22 ENCOUNTER — Emergency Department (HOSPITAL_COMMUNITY)
Admission: EM | Admit: 2019-08-22 | Discharge: 2019-08-22 | Discharge status: Absent without leave | Payer: PRIVATE HEALTH INSURANCE

## 2019-08-22 DIAGNOSIS — K8010 Calculus of gallbladder with chronic cholecystitis without obstruction: Secondary | ICD-10-CM | POA: Diagnosis present

## 2019-08-22 DIAGNOSIS — E785 Hyperlipidemia, unspecified: Secondary | ICD-10-CM | POA: Diagnosis present

## 2019-08-22 DIAGNOSIS — N400 Enlarged prostate without lower urinary tract symptoms: Secondary | ICD-10-CM | POA: Diagnosis present

## 2019-08-22 DIAGNOSIS — J45909 Unspecified asthma, uncomplicated: Secondary | ICD-10-CM | POA: Diagnosis present

## 2019-08-22 DIAGNOSIS — K82A1 Gangrene of gallbladder in cholecystitis: Secondary | ICD-10-CM | POA: Diagnosis present

## 2019-08-22 DIAGNOSIS — Z8709 Personal history of other diseases of the respiratory system: Secondary | ICD-10-CM | POA: Diagnosis not present

## 2019-08-22 DIAGNOSIS — I2510 Atherosclerotic heart disease of native coronary artery without angina pectoris: Secondary | ICD-10-CM | POA: Diagnosis present

## 2019-08-22 DIAGNOSIS — Z79899 Other long term (current) drug therapy: Secondary | ICD-10-CM | POA: Diagnosis not present

## 2019-08-22 DIAGNOSIS — Z20822 Contact with and (suspected) exposure to covid-19: Secondary | ICD-10-CM | POA: Diagnosis present

## 2019-08-22 DIAGNOSIS — Z8042 Family history of malignant neoplasm of prostate: Secondary | ICD-10-CM | POA: Diagnosis not present

## 2019-08-22 DIAGNOSIS — Z86718 Personal history of other venous thrombosis and embolism: Secondary | ICD-10-CM | POA: Diagnosis not present

## 2019-08-22 DIAGNOSIS — Z8249 Family history of ischemic heart disease and other diseases of the circulatory system: Secondary | ICD-10-CM

## 2019-08-22 DIAGNOSIS — Z8547 Personal history of malignant neoplasm of testis: Secondary | ICD-10-CM | POA: Diagnosis not present

## 2019-08-22 DIAGNOSIS — K828 Other specified diseases of gallbladder: Secondary | ICD-10-CM | POA: Diagnosis present

## 2019-08-22 DIAGNOSIS — K219 Gastro-esophageal reflux disease without esophagitis: Secondary | ICD-10-CM | POA: Diagnosis present

## 2019-08-22 DIAGNOSIS — Z8571 Personal history of Hodgkin lymphoma: Secondary | ICD-10-CM | POA: Diagnosis not present

## 2019-08-22 DIAGNOSIS — Z87891 Personal history of nicotine dependence: Secondary | ICD-10-CM

## 2019-08-22 DIAGNOSIS — K810 Acute cholecystitis: Secondary | ICD-10-CM | POA: Diagnosis present

## 2019-08-22 DIAGNOSIS — Z8261 Family history of arthritis: Secondary | ICD-10-CM

## 2019-08-22 DIAGNOSIS — Z880 Allergy status to penicillin: Secondary | ICD-10-CM | POA: Diagnosis not present

## 2019-08-22 DIAGNOSIS — E739 Lactose intolerance, unspecified: Secondary | ICD-10-CM | POA: Diagnosis present

## 2019-08-22 DIAGNOSIS — K2270 Barrett's esophagus without dysplasia: Secondary | ICD-10-CM | POA: Diagnosis present

## 2019-08-22 DIAGNOSIS — Z7982 Long term (current) use of aspirin: Secondary | ICD-10-CM

## 2019-08-22 HISTORY — DX: Calculus of gallbladder with chronic cholecystitis without obstruction: K80.10

## 2019-08-22 LAB — CBC WITH DIFFERENTIAL/PLATELET
Abs Immature Granulocytes: 0.04 10*3/uL (ref 0.00–0.07)
Basophils Absolute: 0 10*3/uL (ref 0.0–0.1)
Basophils Relative: 0 %
Eosinophils Absolute: 0.1 10*3/uL (ref 0.0–0.5)
Eosinophils Relative: 1 %
HCT: 36.2 % — ABNORMAL LOW (ref 39.0–52.0)
Hemoglobin: 12.4 g/dL — ABNORMAL LOW (ref 13.0–17.0)
Immature Granulocytes: 0 %
Lymphocytes Relative: 10 %
Lymphs Abs: 1 10*3/uL (ref 0.7–4.0)
MCH: 31.7 pg (ref 26.0–34.0)
MCHC: 34.3 g/dL (ref 30.0–36.0)
MCV: 92.6 fL (ref 80.0–100.0)
Monocytes Absolute: 1.2 10*3/uL — ABNORMAL HIGH (ref 0.1–1.0)
Monocytes Relative: 13 %
Neutro Abs: 7.3 10*3/uL (ref 1.7–7.7)
Neutrophils Relative %: 76 %
Platelets: 131 10*3/uL — ABNORMAL LOW (ref 150–400)
RBC: 3.91 MIL/uL — ABNORMAL LOW (ref 4.22–5.81)
RDW: 12.1 % (ref 11.5–15.5)
WBC: 9.6 10*3/uL (ref 4.0–10.5)
nRBC: 0 % (ref 0.0–0.2)

## 2019-08-22 LAB — COMPREHENSIVE METABOLIC PANEL
ALT: 59 U/L — ABNORMAL HIGH (ref 0–44)
AST: 46 U/L — ABNORMAL HIGH (ref 15–41)
Albumin: 3.1 g/dL — ABNORMAL LOW (ref 3.5–5.0)
Alkaline Phosphatase: 70 U/L (ref 38–126)
Anion gap: 13 (ref 5–15)
BUN: 25 mg/dL — ABNORMAL HIGH (ref 8–23)
CO2: 22 mmol/L (ref 22–32)
Calcium: 7.9 mg/dL — ABNORMAL LOW (ref 8.9–10.3)
Chloride: 97 mmol/L — ABNORMAL LOW (ref 98–111)
Creatinine, Ser: 1.01 mg/dL (ref 0.61–1.24)
GFR calc Af Amer: >60 mL/min (ref >60)
GFR calc non Af Amer: >60 mL/min (ref >60)
Glucose, Bld: 122 mg/dL — ABNORMAL HIGH (ref 70–99)
Potassium: 3.5 mmol/L (ref 3.5–5.1)
Sodium: 132 mmol/L — ABNORMAL LOW (ref 135–145)
Total Bilirubin: 0.8 mg/dL (ref 0.3–1.2)
Total Protein: 6.6 g/dL (ref 6.5–8.1)

## 2019-08-22 MED ORDER — ACETAMINOPHEN 500 MG PO TABS
1000.0000 mg | ORAL_TABLET | Freq: Four times a day (QID) | ORAL | Status: DC
  Administered 2019-08-22 – 2019-08-23 (×2): 1000 mg via ORAL
  Filled 2019-08-22 (×2): qty 2

## 2019-08-22 MED ORDER — ONDANSETRON 4 MG PO TBDP: 4.0000 mg | ORAL_TABLET | Freq: Four times a day (QID) | ORAL | Status: DC | PRN

## 2019-08-22 MED ORDER — TRAMADOL HCL 50 MG PO TABS
50.0000 mg | ORAL_TABLET | Freq: Four times a day (QID) | ORAL | Status: DC | PRN
  Administered 2019-08-22: 50 mg via ORAL
  Filled 2019-08-22: qty 1

## 2019-08-22 MED ORDER — SODIUM CHLORIDE 0.9 % IV SOLN
2.0000 g | INTRAVENOUS | Status: DC
  Administered 2019-08-22: 2 g via INTRAVENOUS
  Filled 2019-08-22: qty 20
  Filled 2019-08-22: qty 2

## 2019-08-22 MED ORDER — MORPHINE SULFATE (PF) 2 MG/ML IV SOLN: 1.0000 mg | INTRAVENOUS | Status: DC | PRN

## 2019-08-22 MED ORDER — KCL IN DEXTROSE-NACL 20-5-0.45 MEQ/L-%-% IV SOLN
INTRAVENOUS | Status: DC
  Filled 2019-08-22 (×2): qty 1000

## 2019-08-22 MED ORDER — ONDANSETRON HCL 4 MG/2ML IJ SOLN
4.0000 mg | Freq: Four times a day (QID) | INTRAMUSCULAR | Status: DC | PRN
  Administered 2019-08-23: 4 mg via INTRAVENOUS

## 2019-08-22 NOTE — H&P (Deleted)
  The note originally documented on this encounter has been moved the the encounter in which it belongs.  

## 2019-08-22 NOTE — H&P (Signed)
Dwayne Harper  Location: [facility] Patient #: [id number] DOB: 01-17-1946 Married / Language: Undefined / Race: Refused to Report/Unreported Male  History of Present Illness   The patient is a 74 year old male who presents with a complaint of gall bladder disease.  The PCP is Dwayne Harper  His GI physician is Dwayne Harper  He is here with his wife, Dwayne Harper.  He was had a 50th college reunion in [place] on Saturday, June 12. That evening he felt bad, vomited, and developed right upper quadrant pain. At first he thought it was food poisoning. His wife drive him back to [facility] Sunday, June 13. He continued to have pain, chills, and feeling very bad. They took him to the [facility] emergency room on 08/19/2019 where he was evaluated. It sounds like they did not get to him until early AM on 08/20/2019. His WBC was noted to be 16,900. He had a CT scan of his abdomen on 08/20/2019 - which shows a 6 mm stone in the gallbladder neck with suggestion of inflammation of the gall bladder. He had an [place] of his abdomen on 08/20/2019 which showed cholelithiasis and gall bladder sludge, but no wall thickening or pericholecystic fluid. He was released with oxycodone, which he has not tolerated well. He has continued to have pain that is greater than 7/10 and continued to have chills. He was not given antibiotics in the ER.  I discussed with the patient the indications and risks of gall bladder surgery. The primary risks of gall bladder surgery include, but are not limited to, bleeding, infection, common bile duct injury, and open surgery. There is also the risk that the patient may have continued symptoms after surgery. We discussed the typical post-operative recovery course. I tried to answer the patient's questions. I gave the patient literature about gall bladder surgery.  He will not wait for an elective cholecystectomy. I spoke with Drs.  Dwayne Harper ([facility]) and Dwayne Harper) - both are busy. But in discussion with Dr. Ninfa Harper, I think he is best admitted to the hospital for pain controlled, to start on antibiotics, and then his cholecystectomy can be done when time allows.  Review of Systems as stated in this history (HPI) or in the review of systems. Otherwise all other 12 point ROS are negative  Past Medical History: 1. Asthma as a child. He is on no chronic meds 2. Barrett's esophagus Seen by Dwayne Harper in [place], Dwayne [facility] at [facility] 3. CAD (this appeared to follow a syncopal episode - he feel in his garage) Cardiac cath - 2017 4. History of DVT - 09/2015 5. History of left testicular cancer - 1997 6. BPH on Flomax  Social History: Married. wife, Dwayne Harper. He has 3 children: 10 yo, 1 yo, and 66 yo. He is CFO at [employer]  The patient's family history was non contributory.  Diagnostic Studies History [facility] Dwayne Harper, [place]; 08/22/2019 10:47 AM) Colonoscopy  within last year  Allergies [facility] Dwayne Harper, [place]; 08/22/2019 10:47 AM) Penicillins  Allergies Reconciled   Medication History (Dwayne Harper; 08/22/2019 10:50 AM) Atorvastatin Calcium (20MG  Tablet, Oral) Active. Testosterone (1.62% Gel, Transdermal) Active. Tamsulosin HCl (0.4MG  Capsule, Oral) Active. Multivitamin (Oral) Active. CoQ10 (100MG  Capsule, Oral) Active. Glucosamine Chondr 500 Complex (Oral) Specific strength unknown - Active. Solifenacin Succinate (10MG  Tablet, Oral) Active. Vitamin D3 (Oral) Specific strength unknown - Active. Zinc (Oral) Specific strength unknown - Active. Acetaminophen (Oral) Specific strength unknown - Active. Medications Reconciled  Social  History (Dwayne Harper; 08/22/2019 10:47 AM) Alcohol use  Moderate alcohol use. No drug use  Tobacco use  Former smoker.  Family History Dwayne Harper, [place]; 08/22/2019 10:47 AM) Arthritis  Father, Mother. Heart Disease   Mother. Prostate Cancer  Father. Respiratory Condition  Mother.  Other Problems (Dwayne Harper; 08/22/2019 10:47 AM) Arthritis  Asthma  Cancer  Cholelithiasis  Gastroesophageal Reflux Disease     Review of Systems (Dwayne Harper; 08/22/2019 10:47 AM) General Present- Appetite Loss, Chills, Fatigue and Night Sweats. Not Present- Fever, Weight Gain and Weight Loss. Skin Not Present- Change in Wart/Mole, Dryness, Hives, Jaundice, New Lesions, Non-Healing Wounds, Rash and Ulcer. Respiratory Not Present- Bloody sputum, Chronic Cough, Difficulty Breathing, Snoring and Wheezing. Breast Not Present- Breast Mass, Breast Pain, Nipple Discharge and Skin Changes. Gastrointestinal Present- Abdominal Pain. Not Present- Bloating, Bloody Stool, Change in Bowel Habits, Chronic diarrhea, Constipation, Difficulty Swallowing, Excessive gas, Gets full quickly at meals, Hemorrhoids, Indigestion, Nausea, Rectal Pain and Vomiting.  Vitals (Dwayne Harper; 08/22/2019 10:50 AM) 08/22/2019 10:50 AM Weight: 170.38 lb Height: 65in Body Surface Area: 1.85 m Body Mass Index: 28.35 kg/m  Temp.: 98.80F  Pulse: 104 (Regular)  BP: 124/72(Sitting, Left Arm, Standard)   Physical Exam  General: WN WM who is alert and generally healthy appearing. He is wearing a mask. He is in some discomfort. HEENT: Normal. Pupils equal.  Neck: Supple. No mass. No thyroid mass. Lymph Nodes: No supraclavicular or cervical nodes.  Lungs: Clear to auscultation and symmetric breath sounds. Heart: RRR. No murmur or rub.  Abdomen: Soft. No mass. No hernia. Normal bowel sounds. No abdominal scars. He has pain in his right upper quadrant and right mid abdomen. No peritoneal signs. Rectal: Not done.  Extremities: Good strength and ROM in upper and lower extremities.  Neurologic: Grossly intact to motor and sensory function. Psychiatric: Has normal mood and affect. Behavior is normal.   Assessment & Plan   1.  GALL BLADDER DISEASE (K82.9)  Plan:  1. Admission to [facility] for pain control and antibiotics  2. Cholecystectomy when time available. The patient and his wife understand that both hospitals are busy with general surgery cases.  2.  BARRETT ESOPHAGUS (K22.70)   Alphonsa Overall, MD, [facility] Office phone:  [phone]

## 2019-08-23 ENCOUNTER — Encounter (HOSPITAL_COMMUNITY): Admission: RE | Disposition: A | Discharge status: Home / Self Care | Payer: Self-pay | Source: Ambulatory Visit

## 2019-08-23 ENCOUNTER — Inpatient Hospital Stay (HOSPITAL_COMMUNITY): Payer: PRIVATE HEALTH INSURANCE | Admitting: Anesthesiology

## 2019-08-23 ENCOUNTER — Encounter (HOSPITAL_COMMUNITY): Payer: Self-pay

## 2019-08-23 ENCOUNTER — Other Ambulatory Visit: Payer: Self-pay

## 2019-08-23 DIAGNOSIS — K810 Acute cholecystitis: Secondary | ICD-10-CM

## 2019-08-23 HISTORY — PX: LAPAROSCOPIC CHOLECYSTECTOMY SINGLE SITE WITH INTRAOPERATIVE CHOLANGIOGRAM: SHX6538

## 2019-08-23 HISTORY — DX: Acute cholecystitis: K81.0

## 2019-08-23 LAB — SURGICAL PCR SCREEN
MRSA, PCR: NEGATIVE
Staphylococcus aureus: POSITIVE — AB

## 2019-08-23 LAB — SARS CORONAVIRUS 2 (TAT 6-24 HRS): SARS Coronavirus 2: NEGATIVE

## 2019-08-23 SURGERY — LAPAROSCOPIC CHOLECYSTECTOMY SINGLE SITE WITH INTRAOPERATIVE CHOLANGIOGRAM
Anesthesia: General | Site: Abdomen

## 2019-08-23 MED ORDER — SODIUM CHLORIDE 0.9 % IV SOLN: 250.0000 mL | INTRAVENOUS | Status: DC | PRN

## 2019-08-23 MED ORDER — LIP MEDEX EX OINT
1.0000 "application " | TOPICAL_OINTMENT | Freq: Two times a day (BID) | CUTANEOUS | Status: DC
  Administered 2019-08-23 – 2019-08-25 (×4): 1 via TOPICAL
  Filled 2019-08-23: qty 7

## 2019-08-23 MED ORDER — PHENYLEPHRINE 40 MCG/ML (10ML) SYRINGE FOR IV PUSH (FOR BLOOD PRESSURE SUPPORT)
PREFILLED_SYRINGE | INTRAVENOUS | Status: DC | PRN
  Administered 2019-08-23: 80 ug via INTRAVENOUS

## 2019-08-23 MED ORDER — PHENYLEPHRINE HCL (PRESSORS) 10 MG/ML IV SOLN
INTRAVENOUS | Status: AC
  Filled 2019-08-23: qty 1

## 2019-08-23 MED ORDER — ONDANSETRON HCL 4 MG/2ML IJ SOLN
INTRAMUSCULAR | Status: AC
  Filled 2019-08-23: qty 2

## 2019-08-23 MED ORDER — GLYCOPYRROLATE PF 0.2 MG/ML IJ SOSY
PREFILLED_SYRINGE | INTRAMUSCULAR | Status: AC
  Filled 2019-08-23: qty 1

## 2019-08-23 MED ORDER — BUPIVACAINE LIPOSOME 1.3 % IJ SUSP
20.0000 mL | Freq: Once | INTRAMUSCULAR | Status: AC
  Administered 2019-08-23: 20 mL
  Filled 2019-08-23: qty 20

## 2019-08-23 MED ORDER — TRAMADOL HCL 50 MG PO TABS: 50.0000 mg | ORAL_TABLET | Freq: Four times a day (QID) | ORAL | Status: DC | PRN

## 2019-08-23 MED ORDER — ENSURE SURGERY PO LIQD
237.0000 mL | Freq: Two times a day (BID) | ORAL | Status: DC
  Administered 2019-08-24 (×2): 237 mL via ORAL
  Filled 2019-08-23 (×4): qty 237

## 2019-08-23 MED ORDER — ROCURONIUM BROMIDE 10 MG/ML (PF) SYRINGE
PREFILLED_SYRINGE | INTRAVENOUS | Status: AC
  Filled 2019-08-23: qty 10

## 2019-08-23 MED ORDER — BISACODYL 10 MG RE SUPP: 10.0000 mg | Freq: Two times a day (BID) | RECTAL | Status: DC | PRN

## 2019-08-23 MED ORDER — PHENYLEPHRINE HCL-NACL 10-0.9 MG/250ML-% IV SOLN
INTRAVENOUS | Status: DC | PRN
Start: 2019-08-23 — End: 2019-08-23
  Administered 2019-08-23: 25 ug/min via INTRAVENOUS

## 2019-08-23 MED ORDER — BUPIVACAINE HCL (PF) 0.25 % IJ SOLN
INTRAMUSCULAR | Status: DC | PRN
  Administered 2019-08-23: 40 mL

## 2019-08-23 MED ORDER — LIDOCAINE 2% (20 MG/ML) 5 ML SYRINGE
INTRAMUSCULAR | Status: DC | PRN
  Administered 2019-08-23: 60 mg via INTRAVENOUS

## 2019-08-23 MED ORDER — FENTANYL CITRATE (PF) 100 MCG/2ML IJ SOLN
INTRAMUSCULAR | Status: AC
  Filled 2019-08-23: qty 2

## 2019-08-23 MED ORDER — CHLORHEXIDINE GLUCONATE 0.12 % MT SOLN
15.0000 mL | Freq: Once | OROMUCOSAL | Status: DC
  Filled 2019-08-23: qty 15

## 2019-08-23 MED ORDER — DEXAMETHASONE SODIUM PHOSPHATE 10 MG/ML IJ SOLN
INTRAMUSCULAR | Status: DC | PRN
  Administered 2019-08-23: 10 mg via INTRAVENOUS

## 2019-08-23 MED ORDER — FENTANYL CITRATE (PF) 100 MCG/2ML IJ SOLN
INTRAMUSCULAR | Status: DC | PRN
  Administered 2019-08-23 (×2): 50 ug via INTRAVENOUS
  Administered 2019-08-23: 100 ug via INTRAVENOUS
  Administered 2019-08-23: 50 ug via INTRAVENOUS

## 2019-08-23 MED ORDER — SUGAMMADEX SODIUM 200 MG/2ML IV SOLN
INTRAVENOUS | Status: DC | PRN
  Administered 2019-08-23: 170 mg via INTRAVENOUS

## 2019-08-23 MED ORDER — METRONIDAZOLE IN NACL 5-0.79 MG/ML-% IV SOLN
INTRAVENOUS | Status: AC
  Filled 2019-08-23: qty 100

## 2019-08-23 MED ORDER — LIDOCAINE 2% (20 MG/ML) 5 ML SYRINGE
INTRAMUSCULAR | Status: AC
  Filled 2019-08-23: qty 5

## 2019-08-23 MED ORDER — LACTATED RINGERS IR SOLN
Status: DC | PRN
  Administered 2019-08-23: 4000 mL

## 2019-08-23 MED ORDER — PROPOFOL 10 MG/ML IV BOLUS
INTRAVENOUS | Status: DC | PRN
  Administered 2019-08-23: 130 mg via INTRAVENOUS

## 2019-08-23 MED ORDER — LACTATED RINGERS IV SOLN
INTRAVENOUS | Status: DC | PRN
Start: 2019-08-23 — End: 2019-08-23

## 2019-08-23 MED ORDER — SODIUM CHLORIDE 0.9 % IV SOLN
2.0000 g | Freq: Two times a day (BID) | INTRAVENOUS | Status: AC
  Administered 2019-08-23 – 2019-08-24 (×3): 2 g via INTRAVENOUS
  Filled 2019-08-23 (×3): qty 2

## 2019-08-23 MED ORDER — METHOCARBAMOL 500 MG PO TABS: 1000.0000 mg | ORAL_TABLET | Freq: Four times a day (QID) | ORAL | Status: DC | PRN

## 2019-08-23 MED ORDER — ASPIRIN EC 81 MG PO TBEC
81.0000 mg | DELAYED_RELEASE_TABLET | Freq: Every day | ORAL | Status: DC
  Administered 2019-08-24 – 2019-08-25 (×2): 81 mg via ORAL
  Filled 2019-08-23 (×2): qty 1

## 2019-08-23 MED ORDER — EPHEDRINE SULFATE-NACL 50-0.9 MG/10ML-% IV SOSY
PREFILLED_SYRINGE | INTRAVENOUS | Status: DC | PRN
  Administered 2019-08-23: 10 mg via INTRAVENOUS

## 2019-08-23 MED ORDER — HYDROMORPHONE HCL 1 MG/ML IJ SOLN
0.5000 mg | INTRAMUSCULAR | Status: DC | PRN
  Administered 2019-08-23: 0.5 mg via INTRAVENOUS
  Filled 2019-08-23: qty 1

## 2019-08-23 MED ORDER — ACETAMINOPHEN 500 MG PO TABS
500.0000 mg | ORAL_TABLET | Freq: Three times a day (TID) | ORAL | Status: DC
  Administered 2019-08-23 – 2019-08-25 (×6): 500 mg via ORAL
  Filled 2019-08-23 (×6): qty 1

## 2019-08-23 MED ORDER — DEXAMETHASONE SODIUM PHOSPHATE 10 MG/ML IJ SOLN
INTRAMUSCULAR | Status: AC
  Filled 2019-08-23: qty 1

## 2019-08-23 MED ORDER — BUPIVACAINE HCL 0.25 % IJ SOLN
INTRAMUSCULAR | Status: AC
  Filled 2019-08-23: qty 1

## 2019-08-23 MED ORDER — SODIUM CHLORIDE 0.9% FLUSH
3.0000 mL | Freq: Two times a day (BID) | INTRAVENOUS | Status: DC
  Administered 2019-08-25: 3 mL via INTRAVENOUS

## 2019-08-23 MED ORDER — PROPOFOL 500 MG/50ML IV EMUL
INTRAVENOUS | Status: DC | PRN
  Administered 2019-08-23: 150 ug/kg/min via INTRAVENOUS

## 2019-08-23 MED ORDER — CHLORHEXIDINE GLUCONATE CLOTH 2 % EX PADS
6.0000 | MEDICATED_PAD | Freq: Every day | CUTANEOUS | Status: DC
  Administered 2019-08-23: 6 via TOPICAL

## 2019-08-23 MED ORDER — ALBUMIN HUMAN 5 % IV SOLN
12.5000 g | Freq: Four times a day (QID) | INTRAVENOUS | Status: DC | PRN
  Filled 2019-08-23: qty 250

## 2019-08-23 MED ORDER — DIPHENHYDRAMINE HCL 50 MG/ML IJ SOLN: 12.5000 mg | Freq: Four times a day (QID) | INTRAMUSCULAR | Status: DC | PRN

## 2019-08-23 MED ORDER — ONDANSETRON HCL 4 MG/2ML IJ SOLN
INTRAMUSCULAR | Status: DC | PRN
  Administered 2019-08-23: 4 mg via INTRAVENOUS

## 2019-08-23 MED ORDER — 0.9 % SODIUM CHLORIDE (POUR BTL) OPTIME
TOPICAL | Status: DC | PRN
  Administered 2019-08-23: 1000 mL

## 2019-08-23 MED ORDER — ROCURONIUM BROMIDE 10 MG/ML (PF) SYRINGE
PREFILLED_SYRINGE | INTRAVENOUS | Status: DC | PRN
  Administered 2019-08-23: 10 mg via INTRAVENOUS
  Administered 2019-08-23: 80 mg via INTRAVENOUS

## 2019-08-23 MED ORDER — PSYLLIUM 95 % PO PACK
1.0000 | PACK | Freq: Every day | ORAL | Status: DC
  Administered 2019-08-24 – 2019-08-25 (×2): 1 via ORAL
  Filled 2019-08-23 (×2): qty 1

## 2019-08-23 MED ORDER — METRONIDAZOLE IN NACL 5-0.79 MG/ML-% IV SOLN
500.0000 mg | Freq: Four times a day (QID) | INTRAVENOUS | Status: AC
  Administered 2019-08-23 – 2019-08-24 (×5): 500 mg via INTRAVENOUS
  Filled 2019-08-23 (×5): qty 100

## 2019-08-23 MED ORDER — LACTATED RINGERS IV SOLN: INTRAVENOUS | Status: DC

## 2019-08-23 MED ORDER — ENALAPRILAT 1.25 MG/ML IV SOLN
0.6250 mg | Freq: Four times a day (QID) | INTRAVENOUS | Status: DC | PRN
  Filled 2019-08-23: qty 1

## 2019-08-23 MED ORDER — MAGIC MOUTHWASH
15.0000 mL | Freq: Four times a day (QID) | ORAL | Status: DC | PRN
  Filled 2019-08-23: qty 15

## 2019-08-23 MED ORDER — METRONIDAZOLE IN NACL 5-0.79 MG/ML-% IV SOLN
INTRAVENOUS | Status: DC | PRN
  Administered 2019-08-23: 500 mg via INTRAVENOUS

## 2019-08-23 MED ORDER — PROCHLORPERAZINE EDISYLATE 10 MG/2ML IJ SOLN: 5.0000 mg | INTRAMUSCULAR | Status: DC | PRN

## 2019-08-23 MED ORDER — MUPIROCIN 2 % EX OINT
1.0000 "application " | TOPICAL_OINTMENT | Freq: Two times a day (BID) | CUTANEOUS | Status: DC
  Administered 2019-08-23 – 2019-08-25 (×6): 1 via NASAL
  Filled 2019-08-23: qty 22

## 2019-08-23 MED ORDER — PROPOFOL 10 MG/ML IV BOLUS
INTRAVENOUS | Status: AC
  Filled 2019-08-23: qty 20

## 2019-08-23 MED ORDER — SODIUM CHLORIDE 0.9% FLUSH
3.0000 mL | INTRAVENOUS | Status: DC | PRN
  Administered 2019-08-24: 3 mL via INTRAVENOUS

## 2019-08-23 SURGICAL SUPPLY — 43 items
APPLIER CLIP 5 13 M/L LIGAMAX5 (MISCELLANEOUS) ×4
CABLE HIGH FREQUENCY MONO STRZ (ELECTRODE) ×4 IMPLANT
CLIP APPLIE 5 13 M/L LIGAMAX5 (MISCELLANEOUS) ×2 IMPLANT
COVER MAYO STAND STRL (DRAPES) ×4 IMPLANT
COVER SURGICAL LIGHT HANDLE (MISCELLANEOUS) ×4 IMPLANT
COVER WAND RF STERILE (DRAPES) IMPLANT
DECANTER SPIKE VIAL GLASS SM (MISCELLANEOUS) ×4 IMPLANT
DRAIN CHANNEL 19F RND (DRAIN) ×4 IMPLANT
DRAPE C-ARM 42X120 X-RAY (DRAPES) ×4 IMPLANT
DRAPE WARM FLUID 44X44 (DRAPES) ×4 IMPLANT
DRSG TEGADERM 4X4.75 (GAUZE/BANDAGES/DRESSINGS) ×8 IMPLANT
DURAPREP 26ML APPLICATOR (WOUND CARE) ×4 IMPLANT
ELECT REM PT RETURN 15FT ADLT (MISCELLANEOUS) ×4 IMPLANT
ENDOLOOP SUT PDS II  0 18 (SUTURE) ×4
ENDOLOOP SUT PDS II 0 18 (SUTURE) ×4 IMPLANT
EVACUATOR SILICONE 100CC (DRAIN) ×4 IMPLANT
GAUZE SPONGE 2X2 8PLY STRL LF (GAUZE/BANDAGES/DRESSINGS) ×2 IMPLANT
GLOVE ECLIPSE 8.0 STRL XLNG CF (GLOVE) ×4 IMPLANT
GLOVE INDICATOR 8.0 STRL GRN (GLOVE) ×4 IMPLANT
GOWN STRL REUS W/TWL XL LVL3 (GOWN DISPOSABLE) ×8 IMPLANT
IRRIG SUCT STRYKERFLOW 2 WTIP (MISCELLANEOUS) ×4
IRRIGATION SUCT STRKRFLW 2 WTP (MISCELLANEOUS) ×2 IMPLANT
KIT BASIN (CUSTOM PROCEDURE TRAY) ×4 IMPLANT
KIT TURNOVER KIT A (KITS) IMPLANT
PAD POSITIONING PINK XL (MISCELLANEOUS) ×4 IMPLANT
PENCIL SMOKE EVACUATOR (MISCELLANEOUS) IMPLANT
POUCH RETRIEVAL ECOSAC 10 (ENDOMECHANICALS) ×2 IMPLANT
POUCH RETRIEVAL ECOSAC 10MM (ENDOMECHANICALS) ×2
PROTECTOR NERVE ULNAR (MISCELLANEOUS) ×4 IMPLANT
SCISSORS LAP 5X35 DISP (ENDOMECHANICALS) ×4 IMPLANT
SET CHOLANGIOGRAPH MIX (MISCELLANEOUS) ×4 IMPLANT
SET TUBE SMOKE EVAC HIGH FLOW (TUBING) ×4 IMPLANT
SHEARS HARMONIC ACE PLUS 36CM (ENDOMECHANICALS) ×4 IMPLANT
SPONGE GAUZE 2X2 STER 10/PKG (GAUZE/BANDAGES/DRESSINGS) ×2
SUT MNCRL AB 4-0 PS2 18 (SUTURE) ×8 IMPLANT
SUT PDS AB 1 CT1 27 (SUTURE) ×4 IMPLANT
SUT PROLENE 3 0 SH 48 (SUTURE) ×4 IMPLANT
TOWEL OR 17X26 10 PK STRL BLUE (TOWEL DISPOSABLE) ×4 IMPLANT
TOWEL OR NON WOVEN STRL DISP B (DISPOSABLE) ×4 IMPLANT
TRAY LAPAROSCOPIC (CUSTOM PROCEDURE TRAY) ×4 IMPLANT
TROCAR ADV FIXATION 5X100MM (TROCAR) ×4 IMPLANT
TROCAR BLADELESS OPT 5 100 (ENDOMECHANICALS) ×4 IMPLANT
TROCAR BLADELESS OPT 5 150 (ENDOMECHANICALS) ×4 IMPLANT

## 2019-08-23 NOTE — Op Note (Signed)
08/23/2019  PATIENT:  Dwayne Harper  74 y.o. male  Patient Care Team: Lavone Orn, MD as PCP - General (Internal Medicine)  PRE-OPERATIVE DIAGNOSIS:    Acute on Chronic Calculus Cholecystitis  POST-OPERATIVE DIAGNOSIS:   Acute Gangrenous Cholecystitis Empyema of the Gallbladder   PROCEDURE:   DRAINAGE OF EMPYEMA OF GALLBLADDER LAPAROSCOPIC CHOLECYSTECTOMY TAP BLOCK - BILATERAL  SURGEON:  Adin Hector, MD, FACS.  ASSISTANT: RNFA   ANESTHESIA:    General with endotracheal intubation Local anesthetic as a field block  EBL:  (See Anesthesia Intraoperative Record) Total I/O In: 479.6 [I.V.:379.6; IV Piggyback:100] Out: 350 [Blood:350]  Delay start of Pharmacological VTE agent (>24hrs) due to surgical blood loss or risk of bleeding:  no  DRAINS: 19 Fr Blake closed bulb drain in the RUQ   SPECIMEN: Gallbladder    DISPOSITION OF SPECIMEN:  PATHOLOGY  COUNTS:  YES  PLAN OF CARE: Admit to inpatient   PATIENT DISPOSITION:  PACU - hemodynamically stable.  INDICATION: Patient with tachycardia biliary colic with more constant pain suspicious for cholecystitis.  Admitted.  Cholecystectomy offered.  The anatomy & physiology of hepatobiliary & pancreatic function was discussed.  The pathophysiology of gallbladder dysfunction was discussed.  Natural history risks without surgery was discussed.   I feel the risks of no intervention will lead to serious problems that outweigh the operative risks; therefore, I recommended cholecystectomy to remove the pathology.  I explained laparoscopic techniques with possible need for an open approach.  Probable cholangiogram to evaluate the bilary tract was explained as well.    Risks such as bleeding, infection, abscess, leak, injury to other organs, need for further treatment, heart attack, death, and other risks were discussed.  I noted a good likelihood this will help address the problem.  Possibility that this will not correct all  abdominal symptoms was explained.  Goals of post-operative recovery were discussed as well.  We will work to minimize complications.  An educational handout further explaining the pathology and treatment options was given as well.  Questions were answered.  The patient expresses understanding & wishes to proceed with surgery.  OR FINDINGS: Very inflamed gallbladder turgid with patches of necrosis and possible gangrene.  Frank empyema of the gallbladder with stones.  Liver: Very friable with poor tissue quality but no evidence of any cirrhosis.  DESCRIPTION:   The patient was identified & brought in the operating room. The patient was positioned supine with arms tucked. SCDs were active during the entire case. The patient underwent general anesthesia without any difficulty.  The abdomen was prepped and draped in a sterile fashion. A Surgical Timeout confirmed our plan.  I made a transverse curvilinear incision through the superior umbilical fold.  I placed a 33mm long port through the supraumbilical fascia using a modified Hassan cutdown technique with umbilical stalk fascial countertraction. I began carbon dioxide insufflation.  No change in end tidal CO2 measurement.   Camera inspection revealed no injury. There were no adhesions to the anterior abdominal wall supraumbilically.  I proceeded to continue with single site technique. I placed a #5 port in left upper aspect of the wound. I placed a 5 mm atraumatic grasper in the right inferior aspect of the wound.  I turned attention to the right upper quadrant.  Gallbladder was clearly thickened and inflamed.  I was able to free enough greater omentum off to expose the dome of the gallbladder.  I used a laparoscopic needle to aspirate frank pus.  Then help decompress  gallbladder.  However when I grasped elevated the ventral wall are easily fell apart.  Some small stones noted but no spillage.  I placed an extra port in the right upper quadrant.  The  gallbladder fundus was elevated cephalad. I freed adhesions to the ventral surface of the gallbladder off carefully.  I freed the peritoneal coverings between the gallbladder and the liver on the posteriolateral and anteriomedial walls. I alternated between Harmonic & blunt Maryland dissection to help get a good critical view of the cystic artery and cystic duct.  Eventually I transitioned over to a dome down approach to get the inflamed gallbladder off the liver.  I did have to transect somewhat into the edge of the liver at the dome of the gallbladder since it was very intrahepatic.  Liver tissues were very soft and less than the inflammatory adhesions to the gallbladder and liver.  Eventually was able to free it off the liver completely.  Of note the gallbladder easily tore with some traction consistent with a very ischemic gallbladder with patches of necrosis.  Couple areas suspicious or gangrene.  Aspirated small stones and frank empyema pus.   Eventually came around to the infundibulum.  At this point I could skeletonize anterior posterior cystic artery branches.  Could isolate them and control them with harmonic scalpel.  This just left the cystic duct.  It was somewhat narrowed and foreshortened.  Inflamed but no ischemia there.  Did not think it was a good idea to do cholangiogram at this time.  Therefore I ligated the cystic duct using 0 PDS Endoloops to lasso around the entire gallbladder until I came down to the cystic duct and 2 good ligations there.  I also placed 3 clips on the short cystic duct.  I transected the infundibulum off.  I placed the gallbladder inside an EcoSac bag.  I then focused on copious irrigation.  Had been quite oozy throughout the case but cleared up once the inflamed gallbladder had been freed off.  I did meticulous hemostasis on the gallbladder fossa of the liver until I had good hemostasis.  Did serial irrigations.  After 4 L of irrigation and careful cauter,y the area  cleaned up markedly well.  Hemostasis was good on the gallbladder fossa of the liver.  I could see Endoloop good ligation of the cystic duct stump which was clearly the cystic duct since it had a small dot of bile with it.  I decided to leave a drain given the frank empyema of the gallbladder and inflammation.  It is not secured with vertical mattress Prolene suture to good result.  I removed the gallbladder out the supraumbilical fascia inside the sac bag.  I did have to open up the fascia 25 mm just to get the thickened gallbladder out.   I closed the fascia transversely using  #1 PDS interrupted stitches. I closed the skin using 4-0 monocryl stitch.  Sterile dressing was applied. The patient was extubated & arrived in the PACU in stable condition..  I believe the patient require 5 more days of antibiotics minimum.  We will transition to cefepime and metronidazole given his penicillin allergy.  He had tolerated the Rocephin.  I had discussed postoperative care with the patient in the holding area. I discussed operative findings, updated the patient's status, discussed probable steps to recovery, and gave postoperative recommendations to the patient's spouse, Beather Arbour.  Recommendations were made.  Questions were answered.  She expressed understanding & appreciation.  Remo Lipps  C. Johney Maine, M.D., F.A.C.S. Gastrointestinal and Minimally Invasive Surgery [facility] [address], [place], [place] [address] [phone] Main / Paging  08/23/2019 5:40 PM

## 2019-08-23 NOTE — Plan of Care (Signed)

## 2019-08-23 NOTE — Anesthesia Postprocedure Evaluation (Signed)
Anesthesia Post Note  Patient: Dwayne Harper  Procedure(s) Performed: LAPAROSCOPIC CHOLECYSTECTOMY WITH TAP BLOCK BILATERAL. (N/A Abdomen)     Patient location during evaluation: PACU Anesthesia Type: General Level of consciousness: awake and alert Pain management: pain level controlled Vital Signs Assessment: post-procedure vital signs reviewed and stable Respiratory status: spontaneous breathing, nonlabored ventilation, respiratory function stable and patient connected to nasal cannula oxygen Cardiovascular status: blood pressure returned to baseline and stable Postop Assessment: no apparent nausea or vomiting Anesthetic complications: no   No complications documented.  Last Vitals:  Vitals:   08/23/19 1745 08/23/19 1800  BP: 123/66 119/68  Pulse: 62 65  Resp: 18 20  Temp:  37 C  SpO2: 100% 99%    Last Pain:  Vitals:   08/23/19 1800  TempSrc:   PainSc: Asleep                  L 

## 2019-08-23 NOTE — Anesthesia Procedure Notes (Signed)
Procedure Name: Intubation Date/Time: 08/23/2019 3:35 PM Performed by: Silas Sacramento, CRNA Pre-anesthesia Checklist: Patient identified, Emergency Drugs available, Suction available and Patient being monitored Patient Re-evaluated:Patient Re-evaluated prior to induction Oxygen Delivery Method: Circle system utilized Preoxygenation: Pre-oxygenation with 100% oxygen Induction Type: IV induction Ventilation: Mask ventilation without difficulty Laryngoscope Size: Mac and 4 Grade View: Grade I Tube type: Oral Tube size: 7.5 mm Number of attempts: 1 Airway Equipment and Method: Stylet Placement Confirmation: ETT inserted through vocal cords under direct vision,  positive ETCO2 and breath sounds checked- equal and bilateral Secured at: 23 cm Tube secured with: Tape Dental Injury: Teeth and Oropharynx as per pre-operative assessment

## 2019-08-23 NOTE — Plan of Care (Signed)
  Problem: Activity: Goal: Risk for activity intolerance will decrease Outcome: Progressing   Problem: Nutrition: Goal: Adequate nutrition will be maintained Outcome: Progressing   Problem: Coping: Goal: Level of anxiety will decrease Outcome: Progressing   Problem: Pain Managment: Goal: General experience of comfort will improve Outcome: Progressing   

## 2019-08-23 NOTE — Progress Notes (Signed)
[facility] Progress Note  Day of Surgery  Subjective: Patient reports pain in RUQ. Denies nausea currently. Discussed possible OR today vs tomorrow and patient understands. Low grade temp, just got tylenol   Objective: Vital signs in last 24 hours: Temp:  [98.8 F (37.1 C)-100.7 F (38.2 C)] 100.7 F (38.2 C) (06/17 0934) Pulse Rate:  [69-78] 78 (06/17 0934) Resp:  [15-18] 18 (06/17 0934) BP: (104-123)/(68-92) 123/72 (06/17 0934) SpO2:  [84 %-100 %] 98 % (06/17 0934)    Intake/Output from previous day: 06/16 0701 - 06/17 0700 In: 1198.9 [P.O.:360; I.V.:738.9; IV Piggyback:100] Out: 0  Intake/Output this shift: No intake/output data recorded.  PE: General: pleasant, WD, WN white male who is laying in bed in NAD HEENT: Sclera are anicteric.  PERRL.  Ears and nose without any masses or lesions.  Mouth is pink and moist Heart: regular, rate, and rhythm. Palpable radial and pedal pulses bilaterally Lungs: Respiratory effort nonlabored Abd: soft, ttp in RUQ, ND, no masses, hernias, or organomegaly MS: all 4 extremities are symmetrical with no cyanosis, clubbing, or edema. Skin: warm and dry with no masses, lesions, or rashes Neuro: Cranial nerves 2-12 grossly intact, sensation grossly intact throughout Psych: A&Ox3 with an appropriate affect.   Lab Results:  Recent Labs    08/22/19 2221  WBC 9.6  HGB 12.4*  HCT 36.2*  PLT 131*   BMET Recent Labs    08/22/19 2221  NA 132*  K 3.5  CL 97*  CO2 22  GLUCOSE 122*  BUN 25*  CREATININE 1.01  CALCIUM 7.9*   PT/INR No results for input(s): LABPROT, INR in the last 72 hours. CMP     Component Value Date/Time   NA 132 (L) 08/22/2019 2221   K 3.5 08/22/2019 2221   CL 97 (L) 08/22/2019 2221   CO2 22 08/22/2019 2221   GLUCOSE 122 (H) 08/22/2019 2221   BUN 25 (H) 08/22/2019 2221   CREATININE 1.01 08/22/2019 2221   CREATININE 1.00 11/05/2015 1546   CALCIUM 7.9 (L) 08/22/2019 2221   PROT 6.6 08/22/2019  2221   ALBUMIN 3.1 (L) 08/22/2019 2221   AST 46 (H) 08/22/2019 2221   ALT 59 (H) 08/22/2019 2221   ALKPHOS 70 08/22/2019 2221   BILITOT 0.8 08/22/2019 2221   GFRNONAA >60 08/22/2019 2221   GFRAA >60 08/22/2019 2221   Lipase     Component Value Date/Time   LIPASE 20 08/19/2019 2118       Studies/Results: No results found.  Anti-infectives: Anti-infectives (From admission, onward)   Start     Dose/Rate Route Frequency Ordered Stop   08/22/19 2200  cefTRIAXone (ROCEPHIN) 2 g in sodium chloride 0.9 % 100 mL IVPB     Discontinue     2 g 200 mL/hr over 30 Minutes Intravenous Every 24 hours 08/22/19 2117         Assessment/Plan Acute cholecystitis - LFTs minimally elevated, WBC 9 from 16 - continue IV abx - tylenol prn for fever, pain control, antiemetics prn for nausea - possible lap chole today if OR schedule allows - keep NPO for now, ok to have sips with meds - if unable to go to OR today, will put on liquids and plan for lap chole tomorrow  FEN: NPO, IVF VTE: SCDs ID: rocephin 6/16>>  LOS: 1 day    Norm Parcel , [facility] 08/23/2019, 10:19 AM Please see Amion for pager number during day hours 7:00am-4:30pm

## 2019-08-23 NOTE — Discharge Instructions (Signed)
LAPAROSCOPIC SURGERY: POST OP INSTRUCTIONS  ######################################################################  EAT Gradually transition to a high fiber diet with a fiber supplement over the next few weeks after discharge.  Start with a pureed / full liquid diet (see below)  WALK Walk an hour a day.  Control your pain to do that.    CONTROL PAIN Control pain so that you can walk, sleep, tolerate sneezing/coughing, go up/down stairs.  HAVE A BOWEL MOVEMENT DAILY Keep your bowels regular to avoid problems.  OK to try a laxative to override constipation.  OK to use an antidairrheal to slow down diarrhea.  Call if not better after 2 tries  CALL IF YOU HAVE PROBLEMS/CONCERNS Call if you are still struggling despite following these instructions. Call if you have concerns not answered by these instructions  ######################################################################    1. DIET: Follow a light bland diet & liquids the first 24 hours after arrival home, such as soup, liquids, starches, etc.  Be sure to drink plenty of fluids.  Quickly advance to a usual solid diet within a few days.  Avoid fast food or heavy meals as your are more likely to get nauseated or have irregular bowels.  A low-fat, high-fiber diet for the rest of your life is ideal.  2. Take your usually prescribed home medications unless otherwise directed.  3. PAIN CONTROL: a. Pain is best controlled by a usual combination of three different methods TOGETHER: i. Ice/Heat ii. Over the counter pain medication iii. Prescription pain medication b. Most patients will experience some swelling and bruising around the incisions.  Ice packs or heating pads (30-60 minutes up to 6 times a day) will help. Use ice for the first few days to help decrease swelling and bruising, then switch to heat to help relax tight/sore spots and speed recovery.  Some people prefer to use ice alone, heat alone, alternating between ice & heat.   Experiment to what works for you.  Swelling and bruising can take several weeks to resolve.   c. It is helpful to take an over-the-counter pain medication regularly for the first few weeks.  Choose one of the following that works best for you: i. Naproxen (Aleve, etc)  Two 220mg tabs twice a day ii. Ibuprofen (Advil, etc) Three 200mg tabs four times a day (every meal & bedtime) iii. Acetaminophen (Tylenol, etc) 500-650mg four times a day (every meal & bedtime) d. A  prescription for pain medication (such as oxycodone, hydrocodone, tramadol, gabapentin, methocarbamol, etc) should be given to you upon discharge.  Take your pain medication as prescribed.  i. If you are having problems/concerns with the prescription medicine (does not control pain, nausea, vomiting, rash, itching, etc), please call us [phone] to see if we need to switch you to a different pain medicine that will work better for you and/or control your side effect better. ii. If you need a refill on your pain medication, please give us 48 hour notice.  contact your pharmacy.  They will contact our office to request authorization. Prescriptions will not be filled after 5 pm or on week-ends  4. Avoid getting constipated.   a. Between the surgery and the pain medications, it is common to experience some constipation.   b. Increasing fluid intake and taking a fiber supplement (such as Metamucil, Citrucel, FiberCon, MiraLax, etc) 1-2 times a day regularly will usually help prevent this problem from occurring.   c. A mild laxative (prune juice, Milk of Magnesia, MiraLax, etc) should be taken according to   package directions if there are no bowel movements after 48 hours.   5. Watch out for diarrhea.   a. If you have many loose bowel movements, simplify your diet to bland foods & liquids for a few days.   b. Stop any stool softeners and decrease your fiber supplement.   c. Switching to mild anti-diarrheal medications (Kayopectate, Pepto  Bismol) can help.   d. If this worsens or does not improve, please call us.  6. Wash / shower every day.  You may shower over the dressings as they are waterproof.  Continue to shower over incision(s) after the dressing is off.  7. Remove your waterproof bandages 5 days after surgery.  You may leave the incision open to air.  You may replace a dressing/Band-Aid to cover the incision for comfort if you wish.   8. ACTIVITIES as tolerated:   a. You may resume regular (light) daily activities beginning the next day--such as daily self-care, walking, climbing stairs--gradually increasing activities as tolerated.  If you can walk 30 minutes without difficulty, it is safe to try more intense activity such as jogging, treadmill, bicycling, low-impact aerobics, swimming, etc. b. Save the most intensive and strenuous activity for last such as sit-ups, heavy lifting, contact sports, etc  Refrain from any heavy lifting or straining until you are off narcotics for pain control.   c. DO NOT PUSH THROUGH PAIN.  Let pain be your guide: If it hurts to do something, don't do it.  Pain is your body warning you to avoid that activity for another week until the pain goes down. d. You may drive when you are no longer taking prescription pain medication, you can comfortably wear a seatbelt, and you can safely maneuver your car and apply brakes. e. You may have sexual intercourse when it is comfortable.  9. FOLLOW UP in our office a. Please call [facility] at [phone] to set up an appointment to see your surgeon in the office for a follow-up appointment approximately 2-3 weeks after your surgery. b. Make sure that you call for this appointment the day you arrive home to insure a convenient appointment time.  10. IF YOU HAVE DISABILITY OR FAMILY LEAVE FORMS, BRING THEM TO THE OFFICE FOR PROCESSING.  DO NOT GIVE THEM TO YOUR DOCTOR.   WHEN TO CALL US [phone]: 1. Poor pain control 2. Reactions / problems with new  medications (rash/itching, nausea, etc)  3. Fever over 101.5 F (38.5 C) 4. Inability to urinate 5. Nausea and/or vomiting 6. Worsening swelling or bruising 7. Continued bleeding from incision. 8. Increased pain, redness, or drainage from the incision   The clinic staff is available to answer your questions during regular business hours (8:30am-5pm).  Please don't hesitate to call and ask to speak to one of our nurses for clinical concerns.   If you have a medical emergency, go to the nearest emergency room or call 911.  A surgeon from [facility] is always on call at the hospitals   [facility] [address], [address], , [place]  [address] ? MAIN: [phone] ? TOLL FREE: [phone] ?  FAX [phone] [email or web address]   

## 2019-08-23 NOTE — Anesthesia Preprocedure Evaluation (Addendum)
Anesthesia Evaluation  Patient identified by MRN, date of birth, ID band Patient awake    Reviewed: Allergy & Precautions, NPO status , Patient's Chart, lab work & pertinent test results  Airway Mallampati: I  TM Distance: >3 FB Neck ROM: Full    Dental no notable dental hx. (+) Dental Advisory Given, Chipped,    Pulmonary asthma , former smoker,    Pulmonary exam normal breath sounds clear to auscultation       Cardiovascular + CAD and + DVT  Normal cardiovascular exam Rhythm:Regular Rate:Normal  HLD  TTE 2017 - Normal LV wall thickness with LVEF 60-65% and grossly normaldiastolic function. Trivial tricuspid regurgitation. Normal estimated PASP 27 mmHg.   LHC 2017 Prox Cx to Mid Cx lesion, 65 %stenosed. Ost 1st Mrg to 1st Mrg lesion, 70 %stenosed. Mid Cx lesion, 40 %stenosed. Ost LAD to Mid LAD lesion, 30 %stenosed. Ost 1st Diag lesion, 60 %stenosed. Prox RCA to Mid RCA lesion, 20 %stenosed. 1. The RCA his mild diffuse disease in the proximal and mid vessel 2. The Circumflex artery is a small to moderate caliber vessel with moderate mid vessel stenosis just after the takeoff of the small obtuse marginal branch. While this stenosis is moderate, I do not think it is causal for his syncope. He is very active and has no chest pain or dyspnea.  3. The LAD has mild proximal and mid vessel stenosis Recommendations: Medical management of CAD with ASA, statin, beta blocker, prn nitrates. Resume IV heparin drip today 6 hours post TR band removal. If no bleeding from cath access site in the am, resume Eliquis in the am.    Neuro/Psych negative neurological ROS  negative psych ROS   GI/Hepatic Neg liver ROS, hiatal hernia, GERD  Medicated,  Endo/Other  negative endocrine ROS  Renal/GU negative Renal ROS  negative genitourinary   Musculoskeletal negative musculoskeletal ROS (+)   Abdominal   Peds  Hematology negative  hematology ROS (+)   Anesthesia Other Findings   Reproductive/Obstetrics                           Anesthesia Physical Anesthesia Plan  ASA: III  Anesthesia Plan: General   Post-op Pain Management:    Induction: Intravenous  PONV Risk Score and Plan: 2 and Midazolam, Dexamethasone and Ondansetron  Airway Management Planned: Oral ETT  Additional Equipment:   Intra-op Plan:   Post-operative Plan: Extubation in OR  Informed Consent: I have reviewed the patients History and Physical, chart, labs and discussed the procedure including the risks, benefits and alternatives for the proposed anesthesia with the patient or authorized representative who has indicated his/her understanding and acceptance.     Dental advisory given  Plan Discussed with: CRNA  Anesthesia Plan Comments:        Anesthesia Quick Evaluation

## 2019-08-23 NOTE — Transfer of Care (Signed)
Immediate Anesthesia Transfer of Care Note  Patient: Dwayne Harper  Procedure(s) Performed: Procedure(s): LAPAROSCOPIC CHOLECYSTECTOMY WITH TAP BLOCK BILATERAL. (N/A)  Patient Location: PACU  Anesthesia Type:General  Level of Consciousness:  sedated, patient cooperative and responds to stimulation  Airway & Oxygen Therapy:Patient Spontanous Breathing and Patient connected to face mask oxgen  Post-op Assessment:  Report given to PACU RN and Post -op Vital signs reviewed and stable  Post vital signs:  Reviewed and stable  Last Vitals:  Vitals:   08/23/19 1200 08/23/19 1344  BP:  106/64  Pulse:  66  Resp:  16  Temp: 37.1 C 37.4 C  SpO2:  83%    Complications: No apparent anesthesia complications

## 2019-08-24 ENCOUNTER — Encounter (HOSPITAL_COMMUNITY): Payer: Self-pay | Admitting: Surgery

## 2019-08-24 LAB — COMPREHENSIVE METABOLIC PANEL
ALT: 155 U/L — ABNORMAL HIGH (ref 0–44)
AST: 167 U/L — ABNORMAL HIGH (ref 15–41)
Albumin: 2.4 g/dL — ABNORMAL LOW (ref 3.5–5.0)
Alkaline Phosphatase: 79 U/L (ref 38–126)
Anion gap: 9 (ref 5–15)
BUN: 14 mg/dL (ref 8–23)
CO2: 22 mmol/L (ref 22–32)
Calcium: 7.8 mg/dL — ABNORMAL LOW (ref 8.9–10.3)
Chloride: 102 mmol/L (ref 98–111)
Creatinine, Ser: 0.83 mg/dL (ref 0.61–1.24)
GFR calc Af Amer: >60 mL/min (ref >60)
GFR calc non Af Amer: >60 mL/min (ref >60)
Glucose, Bld: 162 mg/dL — ABNORMAL HIGH (ref 70–99)
Potassium: 4.3 mmol/L (ref 3.5–5.1)
Sodium: 133 mmol/L — ABNORMAL LOW (ref 135–145)
Total Bilirubin: 0.6 mg/dL (ref 0.3–1.2)
Total Protein: 5.8 g/dL — ABNORMAL LOW (ref 6.5–8.1)

## 2019-08-24 LAB — CBC
HCT: 32.8 % — ABNORMAL LOW (ref 39.0–52.0)
Hemoglobin: 11.1 g/dL — ABNORMAL LOW (ref 13.0–17.0)
MCH: 31.3 pg (ref 26.0–34.0)
MCHC: 33.8 g/dL (ref 30.0–36.0)
MCV: 92.4 fL (ref 80.0–100.0)
Platelets: 126 10*3/uL — ABNORMAL LOW (ref 150–400)
RBC: 3.55 MIL/uL — ABNORMAL LOW (ref 4.22–5.81)
RDW: 12.3 % (ref 11.5–15.5)
WBC: 7.8 10*3/uL (ref 4.0–10.5)
nRBC: 0 % (ref 0.0–0.2)

## 2019-08-24 MED ORDER — ACETAMINOPHEN-CODEINE #3 300-30 MG PO TABS: 1.0000 | ORAL_TABLET | ORAL | Status: DC | PRN

## 2019-08-24 MED ORDER — METRONIDAZOLE 500 MG PO TABS
500.0000 mg | ORAL_TABLET | Freq: Three times a day (TID) | ORAL | Status: DC
  Administered 2019-08-25: 500 mg via ORAL
  Filled 2019-08-24: qty 1

## 2019-08-24 MED ORDER — CEFDINIR 300 MG PO CAPS
300.0000 mg | ORAL_CAPSULE | Freq: Two times a day (BID) | ORAL | Status: DC
  Administered 2019-08-25: 300 mg via ORAL
  Filled 2019-08-24: qty 1

## 2019-08-24 NOTE — Progress Notes (Signed)
1 Day Post-Op    CC: Abdominal pain  Subjective: He is sore from his surgery.  He is tolerated breakfast so far.  Drain is full of serous/bloody fluid.  Port site dressing is dry.  Objective: Vital signs in last 24 hours: Temp:  [97.9 F (36.6 C)-100.7 F (38.2 C)] 98 F (36.7 C) (06/18 0612) Pulse Rate:  [50-78] 50 (06/18 0612) Resp:  [16-26] 18 (06/18 0612) BP: (102-123)/(62-73) 102/62 (06/18 0612) SpO2:  [95 %-100 %] 96 % (06/18 0612) Weight:  [85 kg] 85 kg (06/18 0746)  280 p.o. 1600 IV 530 urine 95 from the drain 350 blood loss recorded NA 133, glucose 162, calcium 7.8, AST 33>> 46>> 167(6/18) ALT 29>> 59<<155(6/18) Total bilirubin 1.3>> 0.8>> 0.6(6/18) WBC 16.9>> 9.6>> 7.8(6/18) H/H 14.9/43.7>> 12.4/36.2>> 11.1/32.8(6/18)   Intake/Output from previous day: 06/17 0701 - 06/18 0700 In: 2059.6 [P.O.:280; I.V.:1379.6; IV Piggyback:400] Out: 975 [Urine:530; Drains:95; Blood:350] Intake/Output this shift: No intake/output data recorded.  General appearance: alert, cooperative and no distress Resp: clear to auscultation bilaterally and Moved between 1200 and 1500 on the I-S GI: Soft, sore, port site is dry.  JP drain is full of serosanguineous fluid  Lab Results:  Recent Labs    08/22/19 2221 08/24/19 0322  WBC 9.6 7.8  HGB 12.4* 11.1*  HCT 36.2* 32.8*  PLT 131* 126*    BMET Recent Labs    08/22/19 2221 08/24/19 0322  NA 132* 133*  K 3.5 4.3  CL 97* 102  CO2 22 22  GLUCOSE 122* 162*  BUN 25* 14  CREATININE 1.01 0.83  CALCIUM 7.9* 7.8*   PT/INR No results for input(s): LABPROT, INR in the last 72 hours.  Recent Labs  Lab 08/19/19 2118 08/22/19 2221 08/24/19 0322  AST 33 46* 167*  ALT 29 59* 155*  ALKPHOS 52 70 79  BILITOT 1.3* 0.8 0.6  PROT 6.5 6.6 5.8*  ALBUMIN 3.4* 3.1* 2.4*     Lipase     Component Value Date/Time   LIPASE 20 08/19/2019 2118     Medications: . acetaminophen  500 mg Oral TID WC & HS  . aspirin EC  81 mg Oral  Daily  . feeding supplement  237 mL Oral BID BM  . lip balm  1 application Topical BID  . mupirocin ointment  1 application Nasal BID  . psyllium  1 packet Oral Daily  . sodium chloride flush  3 mL Intravenous Q12H   . sodium chloride    . albumin human    . ceFEPime (MAXIPIME) IV Stopped (08/23/19 2348)  . metronidazole Stopped (08/24/19 0521)   Assessment/Plan Hx asthma -no chronic meds Hx Barrett's esophagus -Dr. Daneen Schick mann/Sr. Shaheen [facility] CAD Hx DVT Hx left testicular cancer BPH -Flomax   Acute gangrenous cholecystitis/cholelithiasis, empyema of the gallbladder Laparoscopic cholecystectomy, tap block bilaterally, 08/23/2019 Dr. Michael Boston  FEN: Soft diet VTE: SCDs restart DVT chemical in 24 hrs ID: rocephin 6/16 preop; Maxipime 6/17>> day 2; Flagyl 6/17 >> day 2 - needs 5 more days of abx   Plan: I would watch him another 24 hours repeat labs in the a.m.  Decide on discharge in the AM.  He needs a total of 5 days of antibiotics postop.  We will set him up to go back to the office next week to have his drain removed.  He has a drive to [place] on Friday. I have him an appointment with office nurse to remove drain on 6/22 @10 :69 AM.  Dr.  Gross with be in the office at that time.         LOS: 2 days    , 08/24/2019 Please see Amion

## 2019-08-24 NOTE — Progress Notes (Signed)
Changed dressing at JP site. Cleaned, covered w/ gauze/tape. Educated throughout about site care, dressing change, s/s infection, stripping tubing, emptying and measuring output. Patient verbalized understanding.

## 2019-08-25 LAB — COMPREHENSIVE METABOLIC PANEL
ALT: 113 U/L — ABNORMAL HIGH (ref 0–44)
AST: 80 U/L — ABNORMAL HIGH (ref 15–41)
Albumin: 2.3 g/dL — ABNORMAL LOW (ref 3.5–5.0)
Alkaline Phosphatase: 68 U/L (ref 38–126)
Anion gap: 6 (ref 5–15)
BUN: 20 mg/dL (ref 8–23)
CO2: 25 mmol/L (ref 22–32)
Calcium: 7.4 mg/dL — ABNORMAL LOW (ref 8.9–10.3)
Chloride: 101 mmol/L (ref 98–111)
Creatinine, Ser: 0.91 mg/dL (ref 0.61–1.24)
GFR calc Af Amer: >60 mL/min (ref >60)
GFR calc non Af Amer: >60 mL/min (ref >60)
Glucose, Bld: 107 mg/dL — ABNORMAL HIGH (ref 70–99)
Potassium: 3.7 mmol/L (ref 3.5–5.1)
Sodium: 132 mmol/L — ABNORMAL LOW (ref 135–145)
Total Bilirubin: 0.6 mg/dL (ref 0.3–1.2)
Total Protein: 5.4 g/dL — ABNORMAL LOW (ref 6.5–8.1)

## 2019-08-25 LAB — CBC
HCT: 31.4 % — ABNORMAL LOW (ref 39.0–52.0)
Hemoglobin: 10.8 g/dL — ABNORMAL LOW (ref 13.0–17.0)
MCH: 31.9 pg (ref 26.0–34.0)
MCHC: 34.4 g/dL (ref 30.0–36.0)
MCV: 92.6 fL (ref 80.0–100.0)
Platelets: 168 10*3/uL (ref 150–400)
RBC: 3.39 MIL/uL — ABNORMAL LOW (ref 4.22–5.81)
RDW: 12.6 % (ref 11.5–15.5)
WBC: 9.9 10*3/uL (ref 4.0–10.5)
nRBC: 0 % (ref 0.0–0.2)

## 2019-08-25 NOTE — Discharge Summary (Signed)
Physician Discharge Summary  Patient ID: Dwayne Harper MRN: [id number] DOB/AGE: 1945-07-12 74 y.o.  Admit date: 08/22/2019 Discharge date: 08/25/2019  Admission Diagnoses: Acute cholecystitis  Discharge Diagnoses: Same Principal Problem:   Acute gangrenous cholecystitis s/p lap cholecystectomy 08/23/2019 Active Problems:   Cholecystitis with cholelithiasis   Empyema of gallbladder   Discharged Condition: good  Hospital Course: Patient admitted 6/19 2021 for acute cholecystitis.  He underwent laparoscopic cholecystectomy on 08/23/2019 by Dr. Annie Main gross.  He had a JP drain placed postoperatively.  Postop day 1 he did well but he had significant drainage and was not comfortable leaving.  Postop day 2 he is more comfortable the drainage had subsided.  There was serosanguineous in nature.  No bile.  He had some soreness at the insertion site.  Port sites clean dry intact.  He is tolerating his diet and was taking Tylenol for pain.  He was also showering without difficulty.  He was discharged home on postop day 2 in satisfactory condition.  Consults: None  Significant Diagnostic Studies: labs:  CBC    Component Value Date/Time   WBC 9.9 08/25/2019 0335   RBC 3.39 (L) 08/25/2019 0335   HGB 10.8 (L) 08/25/2019 0335   HCT 31.4 (L) 08/25/2019 0335   PLT 168 08/25/2019 0335   MCV 92.6 08/25/2019 0335   MCH 31.9 08/25/2019 0335   MCHC 34.4 08/25/2019 0335   RDW 12.6 08/25/2019 0335   LYMPHSABS 1.0 08/22/2019 2221   MONOABS 1.2 (H) 08/22/2019 2221   EOSABS 0.1 08/22/2019 2221   BASOSABS 0.0 08/22/2019 2221    Treatments: surgery: Laparoscopic cholecystectomy  Discharge Exam: Blood pressure 110/70, pulse 70, temperature 98.5 F (36.9 C), resp. rate 16, height 5\' 8"  (1.727 m), weight 85 kg, SpO2 90 %. General appearance: alert and cooperative Resp: clear to auscultation bilaterally Cardio: regular rate and rhythm, S1, S2 normal, no murmur, click, rub or gallop GI: JP serous no  bile  port sites CDI   Disposition: Discharge disposition: 01-Home or Self Care       Discharge Instructions    Diet - low sodium heart healthy   Complete by: As directed    Increase activity slowly   Complete by: As directed      Allergies as of 08/25/2019      Reactions   Lactose Intolerance (gi)    unknown   Peanut-containing Drug Products Nausea And Vomiting   Penicillins Other (See Comments)   Unknown reaction in childhood Has patient had a PCN reaction causing immediate rash, facial/tongue/throat swelling, SOB or lightheadedness with hypotension: unknown Has patient had a PCN reaction causing severe rash involving mucus membranes or skin necrosis: unknown Has patient had a PCN reaction that required hospitalization: unknown Has patient had a PCN reaction occurring within the last 10 years: No If all of the above answers are "NO", then may proceed with Cephalosporin use.   Oxycodone Other (See Comments)   "out of it" feeling.  No issue with codiene      Medication List    TAKE these medications   acetaminophen 500 MG tablet Commonly known as: TYLENOL Take 1,000 mg by mouth every 6 (six) hours as needed for mild pain.   aspirin 81 MG EC tablet Take 1 tablet (81 mg total) by mouth daily.   atorvastatin 20 MG tablet Commonly known as: LIPITOR Take 1 tablet (20 mg total) every evening by mouth. Please make overdue yearly appt with Dr. Rayann Heman before anymore refills. 2nd Attempt  CO Q 10 PO Take 1 tablet by mouth at bedtime.   Fish Oil 1000 MG Caps Take 1,000 mg by mouth at bedtime.   glucosamine-chondroitin 500-400 MG tablet Take 1 tablet by mouth at bedtime.   multivitamin tablet Take 1 tablet by mouth at bedtime.   omeprazole 40 MG capsule Commonly known as: PRILOSEC Take 40 mg by mouth at bedtime.   ondansetron 4 MG disintegrating tablet Commonly known as: Zofran ODT Take 1 tablet (4 mg total) by mouth every 8 (eight) hours as needed for nausea.    OVER THE COUNTER MEDICATION Take 1 tablet by mouth at bedtime. Focus Factor   solifenacin 10 MG tablet Commonly known as: VESICARE Take 10 mg by mouth daily.   Systane Balance 0.6 % Soln Generic drug: Propylene Glycol Place 1 drop into both eyes daily as needed (dry eyes).   tadalafil 20 MG tablet Commonly known as: CIALIS Take 20 mg by mouth daily as needed for erectile dysfunction.   tamsulosin 0.4 MG Caps capsule Commonly known as: FLOMAX Take 0.4 mg by mouth daily after breakfast.   Testosterone 1.62 % Gel Apply 40.5 g topically daily.   VITAMIN B-12 PO Take 1 tablet by mouth at bedtime.       Follow-up Information    [facility] [place] Follow up on 09/06/2019.   Specialty: General Surgery Why: your appointment is at 1:45 PM.  Be at the office 30 minutes early for check in.  Bring photo ID and insurance information.   Contact information: [address] [place] [address] [phone]        Michael Boston, MD Follow up on 08/28/2019.   Specialty: General Surgery Why: Your appointment is with the nurse for drain removal.  Be at the office at 10 AM for a 10:30 visit.  Bring your insurance information and photo ID.   Contact information: [address] [place] [address] [phone]               Signed: Joyice Faster   MD 08/25/2019, 10:27 AM

## 2019-08-25 NOTE — Plan of Care (Signed)
  Problem: Activity: Goal: Risk for activity intolerance will decrease Outcome: Progressing   Problem: Nutrition: Goal: Adequate nutrition will be maintained Outcome: Progressing   

## 2019-08-27 LAB — SURGICAL PATHOLOGY

## 2019-09-12 ENCOUNTER — Ambulatory Visit: Payer: PRIVATE HEALTH INSURANCE | Attending: Internal Medicine

## 2019-09-12 DIAGNOSIS — Z20822 Contact with and (suspected) exposure to covid-19: Secondary | ICD-10-CM

## 2019-09-13 LAB — SARS-COV-2, NAA 2 DAY TAT

## 2019-09-13 LAB — NOVEL CORONAVIRUS, NAA: SARS-CoV-2, NAA: NOT DETECTED

## 2021-10-07 ENCOUNTER — Ambulatory Visit: Payer: Medicare Other | Admitting: Podiatry

## 2021-10-21 ENCOUNTER — Encounter: Payer: Self-pay | Admitting: Podiatry

## 2021-10-21 ENCOUNTER — Ambulatory Visit (INDEPENDENT_AMBULATORY_CARE_PROVIDER_SITE_OTHER): Payer: No Typology Code available for payment source | Admitting: Podiatry

## 2021-10-21 DIAGNOSIS — B351 Tinea unguium: Secondary | ICD-10-CM

## 2021-10-21 NOTE — Progress Notes (Signed)
Subjective:   Patient ID: Dwayne Harper, male   DOB: 76 y.o.   MRN: [id number]   HPI Presents concerned about discoloration big toenails both feet second nail right and states that they are not really sore but they are hard for him to cut.  Patient's not been seen for a number years does not smoke likes to be active   Review of Systems  All other systems reviewed and are negative.       Objective:  Physical Exam Vitals and nursing note reviewed.  Constitutional:      Appearance: He is well-developed.  Pulmonary:     Effort: Pulmonary effort is normal.  Musculoskeletal:        General: Normal range of motion.  Skin:    General: Skin is warm.  Neurological:     Mental Status: He is alert.     Neurovascular status found to be intact muscle strength was found to be adequate range of motion adequate with patient found to have discolored big toenails of both feet second right with abnormal structure to the nailbeds but minimal discomfort.  Good digital perfusion well oriented     Assessment:  This appears to be more traumatic than it is fungal even though there may be secondary fungal component     Plan:  H&P explained to him both conditions and I have recommended that he utilize topical medicine do not recommend oral or nail removal but it may be necessary in future and I educated him on what would happen if it were to start to become more tender for him.  Begin formula 7 at this time

## 2022-03-17 DIAGNOSIS — K209 Esophagitis, unspecified without bleeding: Secondary | ICD-10-CM | POA: Insufficient documentation

## 2022-05-28 ENCOUNTER — Ambulatory Visit: Payer: PRIVATE HEALTH INSURANCE | Attending: Cardiology | Admitting: Cardiology

## 2022-05-28 ENCOUNTER — Encounter: Payer: Self-pay | Admitting: Cardiology

## 2022-05-28 VITALS — BP 122/72 | HR 76 | Ht 65.0 in | Wt 159.0 lb

## 2022-05-28 DIAGNOSIS — R9439 Abnormal result of other cardiovascular function study: Secondary | ICD-10-CM

## 2022-05-28 DIAGNOSIS — E785 Hyperlipidemia, unspecified: Secondary | ICD-10-CM | POA: Diagnosis not present

## 2022-05-28 DIAGNOSIS — I2510 Atherosclerotic heart disease of native coronary artery without angina pectoris: Secondary | ICD-10-CM | POA: Insufficient documentation

## 2022-05-28 DIAGNOSIS — R55 Syncope and collapse: Secondary | ICD-10-CM | POA: Diagnosis not present

## 2022-05-28 NOTE — Assessment & Plan Note (Signed)
LDL under goal at 68 on 05/27/22.  -Continue Atorvastatin 20 mg.    Based on clear evidence of CAD albeit nonocclusive on cath, LDL goal less than 70 is reasonable.  He is at goal on moderate dose atorvastatin.

## 2022-05-28 NOTE — Patient Instructions (Addendum)
Medication Instructions:  Not needed  *If you need a refill on your cardiac medications before your next appointment, please call your pharmacy*   Lab Work: Not needed    Testing/Procedures:  CT coronary calcium score.   Test locations:  [place]   This is $99 out of pocket.   Coronary CalciumScan A coronary calcium scan is an imaging test used to look for deposits of calcium and other fatty materials (plaques) in the inner lining of the blood vessels of the heart (coronary arteries). These deposits of calcium and plaques can partly clog and narrow the coronary arteries without producing any symptoms or warning signs. This puts a person at risk for a heart attack. This test can detect these deposits before symptoms develop. Tell a health care provider about: Any allergies you have. All medicines you are taking, including vitamins, herbs, eye drops, creams, and over-the-counter medicines. Any problems you or family members have had with anesthetic medicines. Any blood disorders you have. Any surgeries you have had. Any medical conditions you have. Whether you are pregnant or may be pregnant. What are the risks? Generally, this is a safe procedure. However, problems may occur, including: Harm to a pregnant woman and her unborn baby. This test involves the use of radiation. Radiation exposure can be dangerous to a pregnant woman and her unborn baby. If you are pregnant, you generally should not have this procedure done. Slight increase in the risk of cancer. This is because of the radiation involved in the test. What happens before the procedure? No preparation is needed for this procedure. What happens during the procedure? You will undress and remove any jewelry around your neck or chest. You will put on a hospital gown. Sticky electrodes will be placed on your chest. The electrodes will be connected to an electrocardiogram (ECG) machine to  record a tracing of the electrical activity of your heart. A CT scanner will take pictures of your heart. During this time, you will be asked to lie still and hold your breath for 2-3 seconds while a picture of your heart is being taken. The procedure may vary among health care providers and hospitals. What happens after the procedure? You can get dressed. You can return to your normal activities. It is up to you to get the results of your test. Ask your health care provider, or the department that is doing the test, when your results will be ready. Summary A coronary calcium scan is an imaging test used to look for deposits of calcium and other fatty materials (plaques) in the inner lining of the blood vessels of the heart (coronary arteries). Generally, this is a safe procedure. Tell your health care provider if you are pregnant or may be pregnant. No preparation is needed for this procedure. A CT scanner will take pictures of your heart. You can return to your normal activities after the scan is done. This information is not intended to replace advice given to you by your health care provider. Make sure you discuss any questions you have with your health care provider. Document Released: 08/21/2007 Document Revised: 01/12/2016 Document Reviewed: 01/12/2016 Elsevier Interactive Patient Education  2017 [place]: At [facility], you and your health needs are our priority.  As part of our continuing mission to provide you with exceptional heart care, we have created designated Provider Care Teams.  These Care Teams include your primary Cardiologist (physician) and Advanced Practice Providers (APPs -  Physician Assistants and Nurse Practitioners) who all work together to provide you with the care you need, when you need it.     Your next appointment:    1 to 2 month(s)  The format for your next appointment:   Virtual Visit   Provider:   Glenetta Hew, MD

## 2022-05-28 NOTE — Assessment & Plan Note (Addendum)
CAD risk factors include age and hyperlipidemia. He is asymptomatic without anginal pain, fatigue, or SOB. Pt and wife would prefer to proceed with CT calcium score for risk stratification which has been ordered today.   Cardiac cath reviewed.  Lesions do not appear to be obstructive.  On my assessment, I do not see any lesions more than 60 to 65%.  I think the 70% to 65% lesion documented is probably overcall.  Not obstructive and would treat medically.  Agree with reassessing with Coronary Calcium Score for risk ratification for peace of mind for the family.  He is already on statin with adequate lipid management -> LDL less than 70. BP is stable on no medications. Will recommend aspirin 81 mg based on Coronary Calcium Score findings.

## 2022-05-28 NOTE — Progress Notes (Signed)
Primary Care Provider: Kirby Funk, MD [facility] Cardiologist: Bryan Lemma, MD Electrophysiologist: None  Clinic Note: Chief Complaint  Patient presents with   New Patient (Initial Visit)    Chronic calcification on CT scan    ===================================  ASSESSMENT/PLAN   Problem List Items Addressed This Visit       Cardiology Problems   Hyperlipidemia with target LDL less than 70 (Chronic)    LDL under goal at 68 on 05/27/22.  -Continue Atorvastatin 20 mg.    Based on clear evidence of CAD albeit nonocclusive on cath, LDL goal less than 70 is reasonable.  He is at goal on moderate dose atorvastatin.      Coronary artery disease, non-occlusive - Primary (Chronic)    CAD risk factors include age and hyperlipidemia. He is asymptomatic without anginal pain, fatigue, or SOB. Pt and wife would prefer to proceed with CT calcium score for risk stratification which has been ordered today.   Cardiac cath reviewed.  Lesions do not appear to be obstructive.  On my assessment, I do not see any lesions more than 60 to 65%.  I think the 70% to 65% lesion documented is probably overcall.  Not obstructive and would treat medically.  Agree with reassessing with Coronary Calcium Score for risk ratification for peace of mind for the family.  He is already on statin with adequate lipid management -> LDL less than 70. BP is stable on no medications. Will recommend aspirin 81 mg based on Coronary Calcium Score findings.      Relevant Orders   EKG 12-Lead   CT CARDIAC SCORING (SELF PAY ONLY)     Other   Syncope and collapse    No further episodes      Abnormal stress test (Chronic)    Abnormal GXT back in 2017.  Likely false positive as the coronary angiography did not reveal obstructive disease.  In the future, would use treadmill stress test evaluation.  Would use Lexiscan Myoview or PET       Attending Assessment/Plan: Overall he is doing fairly open  cardiac standpoint with no active symptoms.  They are concerned about his underlying cardiovascular risk.  Unfortunate, the time of my visit with him I have not had a chance to review his cardiac cath films.  I personally reviewed the cath films and do not see lesions that were as significant as 65 to 70%.  May be more like 50%.  However, he does now have a diagnosis moderate CAD.  We ordered a Coronary Calcium Score for restratification, but he is already on statin with an LDL of 68.  Not currently taking aspirin which I probably would recommend that he does not be seen in follow-up.  His blood pressure stable on no meds, therefore I have a hard time of using beta-blocker or ARB unless BP is higher.  I will be reluctant to do a straightforward GXT on him in the future where he to have symptoms.  He would be somewhat that we would want to use an imaging stress test such as Myoview or Stress PET  ===================================  HPI:     Dwayne Harper was last seen on 05/27/2022 with his PCP Dr. Orson Aloe for AWV with no concerns. 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 He really is here today at the urging of his wife who is a patient of mine.  In 2017 he  had a syncopal episode with unclear etiology after workup. Has not had another syncopal episode since. Incidentally, coronary artery calcification was noted on CT scan during this hospitalization.   Recent Hospitalizations: None  Reviewed  CV studies:    The following studies were reviewed today: (if available, images/films reviewed: From Epic Chart or Care Everywhere) TTE 09/27/2015: Normal LV function with EF 60 to 65%.  No RWMA.  Normal valves.  Normal PAP.  Normal RV.  ETT 11/04/2015: Significant ST depression in stage II and early recovery worrisome for ischemia. => Referred for Cardiac cath Cardiac Cath 11/07/2015: p-mCx 65%, Ost Om1 70%, mCx 40%.  Ost-mid LAD 30%, Ost D1 60%. P-m RCA 20%.  Recommend medical management.Dominance: Right    Interval History:   Dwayne Harper presents to establish care. He goes to the gym 5 days/week, does the seated elliptical and pushes himself at which point be becomes SOB but is able to keep going. Denies any CP at rest or SOB. No palpitations, has felt rapid HR in the distant past, not a regular thing. No orthopnea or LE edema.   Mother died of MI at 30, Father had heart disease, died at 21 from prostate cancer. No siblings.  He hasn't smoked for over 50 years  Dwayne Harper is seen today overall doing pretty well.  He is pretty healthy.  Relatively stoic.  Interestingly, they did not mention an episode back in September 2017 during which she had heart catheterization for abnormal treadmill stress test as part of evaluation for syncope.  Recommended medical management. He denies any active cardiac symptoms of chest pain or pressure just maybe little short of breath if he overexerts.  Mild palpitations.  No syncope or near-syncope.  No TIA arms previous.  No PND, edema.  REVIEWED OF SYSTEMS   Review of Systems  Constitutional:  Negative for malaise/fatigue.  Gastrointestinal:  Negative for blood in stool and melena.  Genitourinary:  Negative for hematuria.  Musculoskeletal:        Mild aches and pains  Neurological: Negative.   Psychiatric/Behavioral: Negative.    All other systems reviewed and are negative.  As noted above I have reviewed and (if needed) personally updated the patient's problem list, medications, allergies, past medical and surgical history, social and family history.   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 mCX 40%. Ost-mid LAD 30%, Ost D1 ~60%, Mild RCA plaque.  - Non-obstructive CAD (NOT likely related to syncope).   CATARACT EXTRACTION, BILATERAL     COLONOSCOPY  10/2013   tubular adenoma   Exercise Treadmill Stress Test  11/04/2015   Significant ST depression in stage II and early recovery worrisome  for ischemia. => Referred for Cardiac cath => no obstructive disease noted.  Likely false positive   LAPAROSCOPIC CHOLECYSTECTOMY SINGLE SITE WITH INTRAOPERATIVE CHOLANGIOGRAM N/A 08/23/2019   Procedure: LAPAROSCOPIC CHOLECYSTECTOMY WITH TAP BLOCK BILATERAL.;  Surgeon: Karie Soda, MD;  Location: [facility] ORS;  Service: General;  Laterality: N/A;   TESTICLE REMOVAL Left 1997   cancer   TONSILLECTOMY     TRANSTHORACIC ECHOCARDIOGRAM  09/27/2015   Normal LV function with EF 60 to 65%.  No RWMA.  Normal valves.  Normal PAP.  Normal RV.   WISDOM TOOTH EXTRACTION      Immunization History  Administered Date(s) Administered   Tdap 09/26/2015    MEDICATIONS/ALLERGIES   Current Meds  Medication Sig   acetaminophen (TYLENOL) 500 MG tablet Take 1,000 mg by mouth every 6 (six) hours as needed for mild pain.   atorvastatin (LIPITOR) 20 MG tablet Take 1 tablet (20 mg total) every evening by mouth. Please make overdue yearly appt with Dr. Johney Frame before anymore refills. 2nd Attempt   Coenzyme Q10 (CO Q 10 PO) Take 1 tablet by mouth at bedtime.    Cyanocobalamin (VITAMIN B-12 PO) Take 1 tablet by mouth at bedtime.    glucosamine-chondroitin 500-400 MG tablet Take 1  tablet by mouth at bedtime.    Multiple Vitamin (MULTIVITAMIN) tablet Take 1 tablet by mouth at bedtime.    omeprazole (PRILOSEC) 40 MG capsule Take 40 mg by mouth at bedtime.   OVER THE COUNTER MEDICATION Take 1 tablet by mouth at bedtime. Focus Factor   Propylene Glycol (SYSTANE BALANCE) 0.6 % SOLN Place 1 drop into both eyes daily as needed (dry eyes).   solifenacin (VESICARE) 10 MG tablet Take 10 mg by mouth daily.    tadalafil (CIALIS) 20 MG tablet Take 20 mg by mouth daily as needed for erectile dysfunction.    tamsulosin (FLOMAX) 0.4 MG CAPS capsule Take 0.4 mg by mouth daily after breakfast.    Testosterone 1.62 % GEL Apply 40.5 g topically daily.    Allergies  Allergen Reactions   Lactose Intolerance (Gi)     unknown   Peanut-Containing Drug Products Nausea And Vomiting   Penicillins Other (See Comments)    Unknown reaction in childhood Has patient had a PCN reaction causing immediate rash, facial/tongue/throat swelling, SOB or lightheadedness with hypotension: unknown Has patient had a PCN reaction causing severe rash involving mucus membranes or skin necrosis: unknown Has patient had a PCN reaction that required hospitalization: unknown Has patient had a PCN reaction occurring within the last 10 years: No If all of the above answers are "NO", then may proceed with Cephalosporin use.     Oxycodone Other (See Comments)    "out of it" feeling.  No issue with codiene    SOCIAL HISTORY/FAMILY HISTORY   Reviewed in Epic:   Social History   Tobacco Use   Smoking status: Former    Years: 3    Types: Cigarettes    Quit date: 12/08/1971    Years since quitting: 50.5   Smokeless tobacco: Never   Tobacco comments:    "quit before 1975"  Vaping Use   Vaping Use: Never used  Substance Use Topics   Alcohol use: Yes    Alcohol/week: 3.0 standard drinks of alcohol    Types: 2 Glasses of wine, 1 Cans of beer per week    Comment: 2-3 drinks/week   Drug use: No   Social  History   Social History Narrative   Pt lives  in [place] with spouse.  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  Wife is endowed chair at [employer] in [employer].   Family History  Problem Relation Age of Onset   CAD Mother 17   Heart attack Mother    Atrial fibrillation Mother    Hypertension Mother    Heart disease Mother    Dementia Mother    Coronary artery disease Father    Aneurysm Father    Hypertension Father    Prostate cancer Father    Deep vein thrombosis Father 28       IVC filter   Deep vein thrombosis Daughter        during pregnancy    OBJCTIVE -PE, EKG, labs   Wt Readings from Last 3 Encounters:  05/28/22 159 lb (72.1 kg)  08/24/19 187 lb 6.3 oz (85 kg)  08/19/19 187 lb 6.3 oz (85 kg)    Physical Exam: BP 122/72   Pulse 76   Ht 5\' 5"  (1.651 m)   Wt 159 lb (72.1 kg)   SpO2 95%   BMI 26.46 kg/m   General: NAD, pleasant, able to participate in exam Cardiac: RRR, no murmurs/rubs or gallops.  Normal S1 and S2. Respiratory: CTAB, normal effort, No wheezes, rales or rhonchi Abdomen: nontender, nondistended, soft Extremities: no edema of BLEs; 2+ bilateral pulses Skin: warm and dry Neuro: alert, no obvious focal deficits Psych: Normal affect and mood    Adult ECG Report  Rate: 76 ;  Rhythm: normal sinus rhythm;   Narrative Interpretation: normal  Personally reviewed and signed  Recent Labs:    05/27/2022:  Lipid Panel: Reviewed from PCP's notes T cholesterol 138 Triglyceride 47 HLD 59 LDL 68  CMP and CBC non concerning   ================================================== Time spent with the patient spent in direct patient consultation: Resident - 15 min, attending 8 min = total 23 min Additional time spent with chart review  / charting (studies, outside notes, etc): 9 min Resident - 15 min attending. = 14 min Total Time: 47 min  Current medicines are reviewed at length with the patient today.  (+/- concerns)  n/a  Notice: This dictation was prepared with Dragon dictation along with smart phrase technology. Any transcriptional errors that result from this process are unintentional and may not be corrected upon review.   Studies Ordered:  Orders Placed This Encounter  Procedures   CT CARDIAC SCORING (SELF PAY ONLY)   EKG 12-Lead   No orders of the defined types were placed in this encounter.   Patient Instructions / Medication Changes & Studies & Tests Ordered   Patient Instructions  Medication Instructions:  Not needed  *If you need a refill on your cardiac medications before your next appointment, please call your pharmacy*   Lab Work: Not needed    Testing/Procedures:  CT coronary calcium score.   Test locations:  [facility]   This is $99 out of pocket.   Coronary CalciumScan A coronary calcium scan is an imaging test used to look for deposits of calcium and other fatty materials (plaques) in the inner lining of the blood vessels of the heart (coronary arteries). These deposits of calcium and plaques can partly clog and narrow the coronary arteries without producing any symptoms or warning signs. This puts a person at risk for a heart attack. This test can detect these deposits before symptoms develop.  Follow-Up: At [facility], you and your health needs are  our priority.  As part of our continuing mission to provide you with exceptional heart care, we have created designated Provider Care Teams.  These Care Teams include your primary Cardiologist (physician) and Advanced Practice Providers (APPs -  Physician Assistants and Nurse Practitioners) who all work together to provide you with the care you need, when you need it.     Your next appointment:    1 to 2 month(s)  The format for your next appointment:   Virtual Visit   Provider:   Bryan Lemma, MD    Signed, Erick Alley, DO -- Fam Med Resident.   ATTENDING  ATTESTATION  I have seen, examined and evaluated the patient along with the Resident Physician in clinic today.  I personally performed my own interview & exanimation.  After reviewing all the available data and chart, we discussed the patients laboratory, study & physical findings as well as symptoms in detail. I agree with her findings, examination as well as impression recommendations as per our discussion.    Attending adjustments int the full clinic noted annotated in italics.     Marykay Lex, MD, MS Bryan Lemma, M.D., M.S. Interventional Cardiologist  [facility]  Pager # [phone] Phone # [phone] [address] [place], [place] [address]        Thank you for choosing  [facility] [place]!!

## 2022-05-29 ENCOUNTER — Encounter: Payer: Self-pay | Admitting: Cardiology

## 2022-05-29 NOTE — Assessment & Plan Note (Signed)
Abnormal GXT back in 2017.  Likely false positive as the coronary angiography did not reveal obstructive disease.  In the future, would use treadmill stress test evaluation.  Would use Lexiscan Myoview or PET

## 2022-05-29 NOTE — Progress Notes (Signed)
ATTENDING ATTESTATION  I have seen, examined and evaluated the patient along with the Dr. Ronnald Ramp - Resident Physician in clinic today.  I personally performed my own interview & exanimation.  After reviewing all the available data and chart, we discussed the patients laboratory, study & physical findings as well as symptoms in detail. I agree with her findings, examination as well as impression recommendations as per our discussion.     Clare Gandy presents for cardiac reevaluation.  Unfortunately, I have not gone into detailed review of her chart before 2020.  I realized that in 2017 he had had a heart catheterization.  This was for a syncopal episode that was eventually felt to be idiopathic.  He has never had an episode since then.  Has not had any cardiac symptoms to speak of.  Lipids look great.  Exam is benign.  EKG is normal.   Overall he is doing fairly open cardiac standpoint with no active symptoms.  They are concerned about his underlying cardiovascular risk.  Unfortunate, the time of my visit with him I have not had a chance to review his cardiac cath films.  I personally reviewed the cath films and do not see lesions that were as significant as 65 to 70%.  May be more like 50%.  However, he does now have a diagnosis moderate CAD.  We ordered a Coronary Calcium Score for restratification, but he is already on statin with an LDL of 68.  Not currently taking aspirin which I probably would recommend that he does not be seen in follow-up.  His blood pressure stable on no meds, therefore I have a hard time of using beta-blocker or ARB unless BP is higher.  I will be reluctant to do a straightforward GXT on him in the future where he to have symptoms.  He would be somewhat that we would want to use an imaging stress test such as Myoview or Stress PET     Leonie Man, MD, MS Glenetta Hew, M.D., M.S. Interventional Cardiologist  [place]  Pager # [phone] Phone #  [phone] [address] [place], [place] [address]

## 2022-05-29 NOTE — Assessment & Plan Note (Signed)
No further episodes

## 2022-07-01 ENCOUNTER — Ambulatory Visit (HOSPITAL_BASED_OUTPATIENT_CLINIC_OR_DEPARTMENT_OTHER)
Admission: RE | Admit: 2022-07-01 | Discharge: 2022-07-01 | Disposition: A | Discharge status: Home / Self Care | Payer: Medicare Other | Source: Ambulatory Visit | Attending: Cardiology | Admitting: Cardiology

## 2022-07-01 DIAGNOSIS — I2510 Atherosclerotic heart disease of native coronary artery without angina pectoris: Secondary | ICD-10-CM | POA: Insufficient documentation

## 2022-07-02 ENCOUNTER — Ambulatory Visit: Payer: Medicare Other | Attending: Cardiology | Admitting: Cardiology

## 2022-07-02 ENCOUNTER — Encounter: Payer: Self-pay | Admitting: Cardiology

## 2022-07-02 VITALS — BP 105/55 | HR 70 | Ht 64.0 in | Wt 158.4 lb

## 2022-07-02 DIAGNOSIS — E785 Hyperlipidemia, unspecified: Secondary | ICD-10-CM | POA: Diagnosis present

## 2022-07-02 DIAGNOSIS — I2510 Atherosclerotic heart disease of native coronary artery without angina pectoris: Secondary | ICD-10-CM | POA: Diagnosis present

## 2022-07-02 DIAGNOSIS — E291 Testicular hypofunction: Secondary | ICD-10-CM | POA: Insufficient documentation

## 2022-07-02 DIAGNOSIS — R9439 Abnormal result of other cardiovascular function study: Secondary | ICD-10-CM | POA: Insufficient documentation

## 2022-07-02 MED ORDER — ATORVASTATIN CALCIUM 40 MG PO TABS: 40.0000 mg | ORAL_TABLET | Freq: Every day | ORAL | 3 refills | Status: DC

## 2022-07-02 NOTE — Progress Notes (Signed)
Primary Care Provider: Emilio Aspen, MD [facility] Cardiologist: Bryan Lemma, MD Electrophysiologist: None  Clinic Note: Chief Complaint  Patient presents with   Follow-up    Doing well.  No major symptoms.   Coronary Artery Disease    Elevated Coronary Calcium Score reviewed.  No active angina.  Exercising regularly.   ===================================  ASSESSMENT/PLAN   Problem List Items Addressed This Visit       Cardiology Problems   Hyperlipidemia with target LDL less than 70 (Chronic)    Most recent lipid panel showed LDL of 68.  Would like to try to drive the LDL down below 55 based on how high the Coronary Calcium Score is.  Plan: Increase atorvastatin to 40 mg daily and continue co-Q10.      Relevant Medications   atorvastatin (LIPITOR) 40 MG tablet   Coronary artery disease, non-occlusive - Primary (Chronic)    Significant elevated Coronary Calcium Score with nonobstructive CAD by cath in 2017.  He is very active and exercises vigorously with no active symptoms.  I do not see any reason for doing any more ischemic evaluations.  I think the main course of action here would be to continue to treat medically. I personally reviewed his cath films and do agree there is nonobstructive disease. Ischemic evaluation in the absence of symptoms is none warranted as medical management should be the first plan of action. If he were to have symptoms concerning for angina, would probably consider going straight to cardiac catheterization as opposed to noninvasive evaluation.  Coronary Calcium Score 1676 may make a Coronary CTA difficult to interpret, and previous stress test was falsely positive.  Perhaps a stress PET would be reasonable option, if symptoms are equivocal.  However, if there is concern I would probably consider cardiac catheterization as a first choice.  Plan: Risk factor modification-BP already well-controlled.  Nondiabetic.  Non-smoker. LDL  goal should be less than 55 based on the significant area of calcium scoring =>  will increase atorvastatin to 40 mg daily-PCP should be following labs this fall. Would like for her to be on aspirin, but this been issues with GI standpoint.   Asked that he discuss with his gastroenterologist (Dr. Loreta Ave) to see if he will be okay to start aspirin.        Relevant Medications   atorvastatin (LIPITOR) 40 MG tablet     Other   Hypogonadism in male    Is using testosterone gel as well as Cialis. Managed by urology.  As long as he is not shooting for supranormal levels of testosterone, should be okay.  However there is a potential increased risk for VTE.      Abnormal stress test (Chronic)    False positive GXT evaluated with cardiac catheterization back in 2017 showing nonischemic CAD. Severely elevated Coronary Calcium Score makes Coronary CTA less than favorable.  Per borderline symptoms if ischemic evaluation is warranted, would probably choose either Lexiscan Myoview or stress PET, however her symptoms are concerning for angina, would proceed with cardiac catheterization first.       ===================================  HPI:    Dwayne Harper is a 77 y.o. male with a PMH notable for Nonocclusive CAD by Cardiac Catheterization, with CRF HTN, HLD who presents today for 4-6-week follow-up to discuss Coronary Calcium Score evaluation.  Dwayne Harper is the husband of Ranelle Oyster (also a patient of mine) was seen on May 28, 2022 to reestablish cardiology care.  He has  a history of syncope back in 2017 and part of that evaluation he had a nuclear stress test that was read as abnormal-likely false positive as follow-up catheterization did not show significant CAD.  He has been maintained on aspirin and statin (plus co-Q10) but has had relatively normal blood pressures without requiring medications.  He is very active exercising almost daily without any significant symptoms.   He indicated that he goes to the gym 5 days a week-seated elliptical and other weight machines -> pushed himself to the point where he becomes short of breath but he is able to keep going => no chest pain or pressure or significant dyspnea except to be expected.  No rapid irregular heartbeats palpitations.  No syncope or near syncope. => At his wife's urging, we ordered a coronary calcium score for risk ratification-not unexpectedly calcium score is elevated.  Recent Hospitalizations: None  Reviewed  CV studies:    The following studies were reviewed today: (if available, images/films reviewed: From Epic Chart or Care Everywhere) Coronary Calcium Score 07/05/2022:: CAC score 1676 (Severe Three-Vessel Calcification: LAD 679, and LCx 377, RCA 622)  Interval History:   Dwayne Harper returns for routine follow-up overall doing pretty well.  He still is active exercising denies any exertional dyspnea or angina.   He usually at least once a week will push himself on the elliptical trainer as far as he can-essential to muscle failure.  In doing so he is yet to have any episodes of chest pain or pressure but he does get short of breath and occasionally takes longer to recover. Denies any PND, orthopnea or edema.  No rapid irregular beats palpitations with exception of when he is exercising past.  He gets his heart rate well into the 140 to 150 bpm range.  CV Review of Symptoms (Summary): no chest pain or dyspnea on exertion negative for - edema, irregular heartbeat, orthopnea, palpitations, paroxysmal nocturnal dyspnea, rapid heart rate, shortness of breath, or syncope/near syncope, TIA/amaurosis fugax or claudication.  REVIEWED OF SYSTEMS   Review of Systems  Constitutional:  Negative for malaise/fatigue and weight loss.  HENT:  Negative for nosebleeds.   Respiratory:  Negative for shortness of breath.   Gastrointestinal:  Negative for blood in stool and melena.  Genitourinary:  Negative  for hematuria.  Musculoskeletal:  Negative for joint pain.  Neurological:  Negative for dizziness and focal weakness.  Psychiatric/Behavioral: Negative.    All other systems reviewed and are negative.   I have reviewed and (if needed) personally updated the patient's problem list, medications, allergies, past medical and surgical history, social and family history.   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  mCX 40%. Ost-mid LAD 30%, Ost D1 ~60%, Mild RCA plaque.  - Non-obstructive CAD (NOT likely related to syncope).   CATARACT EXTRACTION, BILATERAL     COLONOSCOPY  10/2013   tubular adenoma   Exercise Treadmill Stress Test  11/04/2015   Significant ST depression in stage II and early recovery worrisome for ischemia. => Referred for Cardiac cath => no obstructive disease noted.  Likely false positive   LAPAROSCOPIC CHOLECYSTECTOMY SINGLE SITE WITH INTRAOPERATIVE CHOLANGIOGRAM N/A 08/23/2019   Procedure: LAPAROSCOPIC CHOLECYSTECTOMY WITH TAP BLOCK BILATERAL.;  Surgeon: Karie Soda, MD;  Location: [facility] ORS;  Service: General;  Laterality: N/A;   TESTICLE REMOVAL Left 1997   cancer   TONSILLECTOMY     TRANSTHORACIC ECHOCARDIOGRAM  09/27/2015   Normal LV function with EF 60 to 65%.  No  RWMA.  Normal valves.  Normal PAP.  Normal RV.   WISDOM TOOTH EXTRACTION      ETT 11/04/2015: Significant ST depression in stage II and early recovery worrisome for ischemia. => Referred for Cardiac cath Cardiac Cath 11/07/2015: p-mCx 65%, Ost Om1 70%, mCx 40%. Ost-mid LAD 30%, Ost D1 60%. P-m RCA 20%.  Recommend medical management.Dominance: Right      MEDICATIONS/ALLERGIES   Current Meds  Medication Sig   acetaminophen (TYLENOL) 500 MG tablet Take 1,000 mg by mouth every 6 (six) hours as needed for mild pain.   Coenzyme Q10 (CO Q 10 PO) Take 1 tablet by mouth at bedtime.    Cyanocobalamin (VITAMIN B-12 PO) Take 1 tablet by mouth at bedtime.    Multiple Vitamin (MULTIVITAMIN) tablet Take 1 tablet by mouth at bedtime.    omeprazole (PRILOSEC) 40 MG capsule Take 40 mg by mouth at bedtime.   OVER THE COUNTER MEDICATION Take 1 tablet by mouth in the morning and at bedtime. Focus Factor   Propylene Glycol (SYSTANE BALANCE) 0.6 % SOLN Place 1 drop into both eyes daily as needed (dry eyes).   solifenacin (VESICARE) 10 MG tablet Take 10 mg by mouth daily.    tadalafil (CIALIS) 20 MG tablet Take 20 mg by mouth daily as needed for erectile dysfunction.    tamsulosin (FLOMAX) 0.4 MG CAPS capsule Take 0.4 mg by mouth daily after breakfast.    Testosterone 1.62 % GEL Apply 40.5 g topically daily.   []  atorvastatin (LIPITOR) 20 MG tablet Take 1 tablet (20 mg total) every evening by mouth.    Allergies  Allergen Reactions   Lactose Intolerance (Gi)     unknown   Penicillins Other (See Comments)    Unknown reaction in childhood Has patient had a PCN reaction causing immediate rash, facial/tongue/throat swelling, SOB or lightheadedness with hypotension: unknown Has patient had a PCN reaction causing severe rash involving mucus membranes or skin necrosis: unknown Has patient had a PCN reaction that required hospitalization: unknown Has patient had a PCN reaction occurring within the last 10 years:  No If all of the above answers are "NO", then may proceed with Cephalosporin use.     Oxycodone Other (See Comments)    "out of it" feeling.  No issue with codiene    SOCIAL HISTORY/FAMILY HISTORY   Reviewed in Epic:  Pertinent findings:  Social History   Tobacco Use   Smoking status: Former    Years: 3    Types: Cigarettes    Quit date: 12/08/1971    Years since quitting: 50.6   Smokeless tobacco: Never   Tobacco comments:    "quit before 1975"  Vaping  Use   Vaping Use: Never used  Substance Use Topics   Alcohol use: Yes    Alcohol/week: 3.0 standard drinks of alcohol    Types: 2 Glasses of wine, 1 Cans of beer per week    Comment: 2-3 drinks/week   Drug use: No   Social History   Social History Narrative   Pt lives in [place] with spouse.  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  Wife is endowed chair at [employer] in [employer].    OBJCTIVE -PE, EKG, labs   Wt Readings from Last 3 Encounters:  07/02/22 158 lb 6.4 oz (71.8 kg)  05/28/22 159 lb (72.1 kg)  08/24/19 187 lb 6.3 oz (85 kg)    Physical Exam: BP (!) 105/55   Pulse 70   Ht 5\' 4"  (1.626 m)   Wt 158 lb 6.4 oz (71.8 kg)   SpO2 99%   BMI 27.19 kg/m  Physical Exam Vitals reviewed.  Constitutional:      General: He is not in acute distress.    Appearance: Normal appearance. He is normal weight. He is not ill-appearing (Well-nourished, well-groomed; healthy appearing.) or toxic-appearing.  Neck:     Vascular: No carotid bruit or JVD.  Cardiovascular:     Rate and Rhythm: Normal rate and regular rhythm. No extrasystoles are present.    Chest Wall: PMI is not displaced.     Pulses: Normal pulses.     Heart sounds: Normal heart sounds. No murmur heard.    No gallop.  Pulmonary:     Effort: Pulmonary effort is normal. No respiratory distress.     Breath sounds: Normal breath sounds. No wheezing, rhonchi or rales.  Musculoskeletal:        General: No swelling. Normal range of  motion.     Cervical back: Normal range of motion and neck supple.  Skin:    General: Skin is warm and dry.  Neurological:     General: No focal deficit present.     Mental Status: He is alert and oriented to person, place, and time.     Gait: Gait normal.  Psychiatric:        Mood and Affect: Mood normal.        Behavior: Behavior normal.        Thought Content: Thought content normal.        Judgment: Judgment normal.     Adult ECG Report N/a  Recent Labs:   05/27/2022: TC 138, TG 47, HDL 59, LDL 68. Hgb 15.1, Cr 1.17, K+ 5.0.   ================================================== I spent a total of 24 minutes with the patient spent in direct patient consultation.  Additional time spent with chart review  / charting (studies, outside notes, etc): 17 min Total Time: 41 min  Current medicines are reviewed at length with the patient today.  (+/- concerns) n/a  Notice: This dictation was prepared with Dragon dictation along with smart phrase technology. Any transcriptional errors that result from this process are unintentional and may not be corrected upon review.  Studies Ordered:   No orders of the defined types were placed in this encounter.  Meds ordered this encounter  Medications   atorvastatin (LIPITOR) 40 MG tablet    Sig: Take 1 tablet (40 mg total) by mouth daily.    Dispense:  90 tablet    Refill:  3      Patient Instructions / Medication Changes & Studies & Tests Ordered  Patient Instructions  Medication Instructions:    Increase Atorvastatin to 40 mg  daily - preferably take at bedtime   Ask Dr Loreta Ave if it would be okay to start taking Asprin 81 mg daily   *If you need a refill on your cardiac medications before your next appointment, please call your pharmacy*   Lab Work:  If you have labs (blood work) drawn today and your tests are completely normal, you will receive your results only by: MyChart Message (if you have MyChart) OR A paper copy in  the mail If you have any lab test that is abnormal or we need to change your treatment, we will call you to review the results.   Testing/Procedures: Not  needed   Follow-Up: At [facility], you and your health needs are our priority.  As part of our continuing mission to provide you with exceptional heart care, we have created designated Provider Care Teams.  These Care Teams include your primary Cardiologist (physician) and Advanced Practice Providers (APPs -  Physician Assistants and Nurse Practitioners) who all work together to provide you with the care you need, when you need it.     Your next appointment:   12 month(s)  The format for your next appointment:   In Person  Provider:   Bryan Lemma, MD    Other Instructions      Marykay Lex, MD, MS Bryan Harper, M.D., M.S. Interventional Cardiologist  [facility]  Pager # [phone] Phone # [phone] [address] [place], [place] [address]   Thank you for choosing [facility] [place]!!

## 2022-07-02 NOTE — Patient Instructions (Addendum)
Medication Instructions:    Increase Atorvastatin to 40 mg  daily - preferably take at bedtime   Ask Dr Loreta Ave if it would be okay to start taking Asprin 81 mg daily   *If you need a refill on your cardiac medications before your next appointment, please call your pharmacy*   Lab Work:  If you have labs (blood work) drawn today and your tests are completely normal, you will receive your results only by: MyChart Message (if you have MyChart) OR A paper copy in the mail If you have any lab test that is abnormal or we need to change your treatment, we will call you to review the results.   Testing/Procedures: Not  needed   Follow-Up: At [facility], you and your health needs are our priority.  As part of our continuing mission to provide you with exceptional heart care, we have created designated Provider Care Teams.  These Care Teams include your primary Cardiologist (physician) and Advanced Practice Providers (APPs -  Physician Assistants and Nurse Practitioners) who all work together to provide you with the care you need, when you need it.     Your next appointment:   12 month(s)  The format for your next appointment:   In Person  Provider:   Bryan Lemma, MD    Other Instructions

## 2022-07-08 ENCOUNTER — Other Ambulatory Visit (HOSPITAL_COMMUNITY): Payer: Self-pay

## 2022-07-08 ENCOUNTER — Other Ambulatory Visit (HOSPITAL_COMMUNITY): Payer: Self-pay | Admitting: Internal Medicine

## 2022-07-08 ENCOUNTER — Ambulatory Visit (HOSPITAL_COMMUNITY)
Admission: RE | Admit: 2022-07-08 | Discharge: 2022-07-08 | Disposition: A | Discharge status: Home / Self Care | Payer: Medicare Other | Source: Ambulatory Visit | Attending: Vascular Surgery | Admitting: Vascular Surgery

## 2022-07-08 ENCOUNTER — Encounter (HOSPITAL_COMMUNITY): Payer: Self-pay | Admitting: Student-PharmD

## 2022-07-08 ENCOUNTER — Ambulatory Visit (HOSPITAL_BASED_OUTPATIENT_CLINIC_OR_DEPARTMENT_OTHER)
Admission: RE | Admit: 2022-07-08 | Discharge: 2022-07-08 | Disposition: A | Discharge status: Home / Self Care | Payer: Medicare Other | Source: Ambulatory Visit | Attending: Vascular Surgery | Admitting: Vascular Surgery

## 2022-07-08 DIAGNOSIS — I82432 Acute embolism and thrombosis of left popliteal vein: Secondary | ICD-10-CM

## 2022-07-08 DIAGNOSIS — M7989 Other specified soft tissue disorders: Secondary | ICD-10-CM

## 2022-07-08 HISTORY — DX: Acute embolism and thrombosis of left popliteal vein: I82.432

## 2022-07-08 MED ORDER — APIXABAN (ELIQUIS) VTE STARTER PACK (10MG AND 5MG)
ORAL_TABLET | ORAL | 0 refills | Status: DC
  Filled 2022-07-08: qty 74, 30d supply, fill #0

## 2022-07-08 NOTE — Progress Notes (Signed)
DVT Clinic Note  Name: Dwayne Harper     MRN: [id number]     DOB: May 16, 1945     Sex: male  PCP: Emilio Aspen, MD  Today's Visit: Visit Information: Initial Visit  Referred to DVT Clinic by: Dr. Orson Aloe (PCP at [facility])  Referred to CPP by: Dr. Karin Lieu Reason for referral:  Chief Complaint  Patient presents with   DVT   HISTORY OF PRESENT ILLNESS:  Dwayne Harper is a 77 y.o. 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 Per chart review, DVT in 2017 was provoked by recent prolonged travel and he was treated with Eliquis for 6 months. No bleeding or complications while on Eliquis previously. Patient reports that his leg tightness began two days ago on 07/06/22. He saw his PCP Dr. Orson Aloe today who scheduled an ultrasound which showed acute DVT involving the L popliteal, peroneal, and posterior tibial veins. Patient reports that he is very active working in the yard and exercising at [employer] multiple times per week. He notes that he has used testosterone gel for the past ~5 years but only in the past few months did he switch from applying it to his neck/arms to locally to the groin.   Positive Thrombotic Risk Factors: Previous VTE, Testosterone therapy, Older Age Bleeding Risk Factors: Age >65 years  Negative Thrombotic Risk Factors: Recent surgery (within 3 months), Recent trauma (within 3 months), Recent admission to hospital with acute illness (within 3 months), Paralysis, paresis, or recent plaster cast immobilization of lower extremity, Central venous catheterization, Sedentary journey lasting >8 hours within 4 weeks, Pregnancy, Estrogen therapy, Active cancer, Recent COVID diagnosis (within 3 months), Recent cesarean section (within 3 months), Bed rest >72 hours within 3 months, Within 6 weeks postpartum,  Erythropoiesis-stimulating agent, Obesity, Known thrombophilic condition, Non-malignant, chronic inflammatory condition, Smoking  Rx Insurance Coverage: Medicare Rx Affordability: Used one time $0 copay card today to fill the Eliquis starter pack at [facility] during the visit. He does have a deductible he will have to pay through on his next refill then future copays after that will be $45/month, which the patient confirms will be affordable.  Preferred Pharmacy: Will send refills to [facility] once we get result of prior authorization.   Past Medical History:  Diagnosis Date   Asthma    Barrett esophagus    BPH (benign prostatic hyperplasia)    DVT (deep venous thrombosis) (HCC) 09/2015   LLE   ED (erectile dysfunction)    GERD (gastroesophageal reflux disease)    Hiatal hernia    endoscopy    History of gout    Hyperlipidemia 11/08/2015   Pneumonia 1950s   Scalp hematoma    Syncope 09/26/2015   Testicular cancer [facility]) 1997   orchiectomy   Tubular adenoma     Past Surgical History:  Procedure Laterality Date   CARDIAC CATHETERIZATION N/A 11/07/2015   Procedure: Left Heart Cath and Coronary Angiography;  Surgeon: Kathleene Hazel, MD;  Location: [facility] INVASIVE CV LAB;  Service: CV:  p-mCx ~60-65%, Ost OM1 ~65-70%. mCX 40%. Ost-mid LAD 30%, Ost D1 ~60%, Mild RCA plaque.  - Non-obstructive CAD (NOT likely related to syncope).   CATARACT EXTRACTION, BILATERAL     COLONOSCOPY  10/2013   tubular adenoma   Exercise Treadmill Stress Test  11/04/2015   Significant ST depression in  stage II and early recovery worrisome for ischemia. => Referred for Cardiac cath => no obstructive disease noted.  Likely false positive   LAPAROSCOPIC CHOLECYSTECTOMY SINGLE SITE WITH INTRAOPERATIVE CHOLANGIOGRAM N/A 08/23/2019   Procedure: LAPAROSCOPIC CHOLECYSTECTOMY WITH TAP BLOCK BILATERAL.;  Surgeon: Karie Soda, MD;  Location: [facility] ORS;  Service: General;  Laterality: N/A;   TESTICLE REMOVAL  Left 1997   cancer   TONSILLECTOMY     TRANSTHORACIC ECHOCARDIOGRAM  09/27/2015   Normal LV function with EF 60 to 65%.  No RWMA.  Normal valves.  Normal PAP.  Normal RV.   WISDOM TOOTH EXTRACTION      Social History   Socioeconomic History   Marital status: Married    Spouse name: Not on file   Number of children: Not on file   Years of education: Not on file   Highest education level: Not on file  Occupational History   Occupation: CFO    Comment: [employer]  Tobacco Use   Smoking status: Former    Years: 3    Types: Cigarettes    Quit date: 12/08/1971    Years since quitting: 50.6   Smokeless tobacco: Never   Tobacco comments:    "quit before 1975"  Vaping Use   Vaping Use: Never used  Substance and Sexual Activity   Alcohol use: Yes    Alcohol/week: 3.0 standard drinks of alcohol    Types: 2 Glasses of wine, 1 Cans of beer per week    Comment: 2-3 drinks/week   Drug use: No   Sexual activity: Not Currently  Other Topics Concern   Not on file  Social History Narrative   Pt lives in [place] with spouse.  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  Wife is endowed chair at [employer] in [employer].   Social Determinants of Health   Financial Resource Strain: Not on file  Food Insecurity: Not on file  Transportation Needs: Not on file  Physical Activity: Not on file  Stress: Not on file  Social Connections: Not on file  Intimate Partner Violence: Not on file    Family History  Problem Relation Age of Onset   CAD Mother 82   Heart attack Mother    Atrial fibrillation Mother    Hypertension Mother    Heart disease Mother    Dementia Mother    Coronary artery disease Father    Aneurysm Father    Hypertension Father    Prostate cancer Father    Deep vein thrombosis Father 5       IVC filter   Deep vein thrombosis Daughter        during pregnancy    Allergies as of 07/08/2022 - Review Complete 07/08/2022  Allergen Reaction Noted    Lactose intolerance (gi)  09/26/2015   Penicillins Other (See Comments)    Oxycodone Other (See Comments) 08/23/2019    Current Outpatient Medications on File Prior to Encounter  Medication Sig Dispense Refill   acetaminophen (TYLENOL) 500 MG tablet Take 1,000 mg by mouth every 6 (six) hours as needed for mild pain.     atorvastatin (LIPITOR) 40 MG tablet Take 1 tablet (40 mg total) by mouth daily. 90 tablet 3   Coenzyme Q10 (CO Q 10 PO) Take 1 tablet by mouth at bedtime.      Cyanocobalamin (VITAMIN B-12 PO) Take 1 tablet by mouth at bedtime.      Multiple Vitamin (MULTIVITAMIN) tablet Take 1 tablet by  mouth at bedtime.      omeprazole (PRILOSEC) 40 MG capsule Take 40 mg by mouth at bedtime.     OVER THE COUNTER MEDICATION Take 1 tablet by mouth in the morning and at bedtime. Focus Factor     Propylene Glycol (SYSTANE BALANCE) 0.6 % SOLN Place 1 drop into both eyes daily as needed (dry eyes).     solifenacin (VESICARE) 10 MG tablet Take 10 mg by mouth daily.      tadalafil (CIALIS) 20 MG tablet Take 20 mg by mouth daily as needed for erectile dysfunction.      tamsulosin (FLOMAX) 0.4 MG CAPS capsule Take 0.4 mg by mouth daily after breakfast.      Testosterone 1.62 % GEL Apply 40.5 g topically daily.     fluticasone-salmeterol (ADVAIR) 100-50 MCG/ACT AEPB Inhale 1 puff into the lungs 2 (two) times daily.     No current facility-administered medications on file prior to encounter.   REVIEW OF SYSTEMS:  Review of Systems  Respiratory:  Negative for shortness of breath.   Cardiovascular:  Positive for leg swelling. Negative for chest pain and palpitations.  Musculoskeletal:  Positive for myalgias.  Neurological:  Positive for tingling. Negative for dizziness.   PHYSICAL EXAMINATION:  Physical Exam Vitals reviewed.  Cardiovascular:     Rate and Rhythm: Normal rate.  Pulmonary:     Effort: Pulmonary effort is normal.  Musculoskeletal:        General: Tenderness present.     Left  lower leg: Edema present.  Skin:    Findings: No erythema.  Psychiatric:        Mood and Affect: Mood normal.        Behavior: Behavior normal.        Thought Content: Thought content normal.   Villalta Score for Post-Thrombotic Syndrome: Pain: Mild Cramps: Absent Heaviness: Absent Paresthesia: Mild Pruritus: Mild Pretibial Edema: Mild Skin Induration: Absent Hyperpigmentation: Absent Redness: Absent Venous Ectasia: Absent Pain on calf compression: Mild Villalta Preliminary Score: 5 Is venous ulcer present?: No If venous ulcer is present and score is <15, then 15 points total are assigned: Absent Villalta Total Score: 5  LABS:  CBC     Component Value Date/Time   WBC 9.9 08/25/2019 0335   RBC 3.39 (L) 08/25/2019 0335   HGB 10.8 (L) 08/25/2019 0335   HCT 31.4 (L) 08/25/2019 0335   PLT 168 08/25/2019 0335   MCV 92.6 08/25/2019 0335   MCH 31.9 08/25/2019 0335   MCHC 34.4 08/25/2019 0335   RDW 12.6 08/25/2019 0335   LYMPHSABS 1.0 08/22/2019 2221   MONOABS 1.2 (H) 08/22/2019 2221   EOSABS 0.1 08/22/2019 2221   BASOSABS 0.0 08/22/2019 2221    Hepatic Function      Component Value Date/Time   PROT 5.4 (L) 08/25/2019 0335   ALBUMIN 2.3 (L) 08/25/2019 0335   AST 80 (H) 08/25/2019 0335   ALT 113 (H) 08/25/2019 0335   ALKPHOS 68 08/25/2019 0335   BILITOT 0.6 08/25/2019 0335   BILIDIR 0.1 12/08/2015 1606   IBILI 0.5 12/08/2015 1606    Renal Function   Lab Results  Component Value Date   CREATININE 0.91 08/25/2019   CREATININE 0.83 08/24/2019   CREATININE 1.01 08/22/2019    CrCl cannot be calculated (Patient's most recent lab result is older than the maximum 21 days allowed.).   Labs available in [facility]:  05/27/22: ALT 21, AST 24, serum creatinine 1.17, eGFR 65, hemoglobin 15.1, platelets 168K CBC and  d-dimer were drawn at PCP appt today 07/08/22 but results are not yet available.   [facility] Vascular Lab Studies:  07/08/22 VAS [place] LOWER EXTREMITY VENOUS LEFT (DVT)  Summary:   RIGHT:  - No evidence of common femoral vein obstruction.   LEFT:  - Findings consistent with acute deep vein thrombosis involving the left  popliteal vein, left posterior tibial veins, and left peroneal veins.   ASSESSMENT: Location of DVT: Left popliteal vein, Left distal vein Cause of DVT: unprovoked. Patient with history of provoked DVT in 2017 related to prolonged travel. No clear provoking risk factors for this DVT. Patient notes that he has been taking topical testosterone gel for at least 5 years but in the past few months switched application from neck/arms to groin. It is unclear whether this change would increase the absorption significantly enough to cause a DVT. Since he has been on testosterone for many years for hypogonadism secondary to orchiectomy for testicular cancer, it is unlikely to play a large role in causing DVT now. Will defer further discussion on that to hematology, as we feel he would benefit from further work up from them for recurrent DVT.    Patient had no bleeding or complications while on Eliquis previously. Eliquis is preferred to Xarelto in patients >65 years old given lower risks of bleeding in this population. Eliquis was filled today during the visit at our [facility], and I explained the starter pack dosing to him. Eliquis does require a prior authorization that we are waiting to hear back on from his insurance, but the starter pack provides a 30 day supply, so we will hear back and send in refills prior to him running out. I will keep the patient in the loop on this.   PLAN: -Start apixaban (Eliquis) 10 mg twice daily for 7 days followed by 5 mg twice daily. -Expected duration of therapy: Likely long-term, will defer to hematology after further work up. Therapy started on 07/08/22. -Patient educated on purpose, proper use and potential adverse effects of apixaban (Eliquis). -Discussed importance of taking medication around the same time every day. -Advised  patient of medications to avoid (NSAIDs, aspirin doses >100 mg daily). -Educated that Tylenol (acetaminophen) is the preferred analgesic to lower the risk of bleeding. -Advised patient to alert all providers of anticoagulation therapy prior to starting a new medication or having a procedure. -Emphasized importance of monitoring for signs and symptoms of bleeding (abnormal bruising, prolonged bleeding, nose bleeds, bleeding from gums, discolored urine, black tarry stools). -Educated patient to present to the ED if emergent signs and symptoms of new thrombosis occur. -Counseled patient to wear compression stockings daily, removing at night.  Follow up: 1 month in DVT Clinic. Referral to hematology placed.   Pervis Hocking, PharmD, [place]y [place], CPP Deep Vein Thrombosis Clinic Clinical Pharmacist Practitioner Office: [phone]

## 2022-07-08 NOTE — Patient Instructions (Signed)
-  Start apixaban (Eliquis) 10 mg twice daily for 7 days followed by 5 mg twice daily. -I will call you with an update on the prior authorization for your medication and when I've sent in refills.  -It is important to take your medication around the same time every day.  -Avoid NSAIDs like ibuprofen (Advil, Motrin) and naproxen (Aleve) as well as aspirin doses over 100 mg daily. -Tylenol (acetaminophen) is the preferred over the counter pain medication to lower the risk of bleeding. -Be sure to alert all of your health care providers that you are taking an anticoagulant prior to starting a new medication or having a procedure. -Monitor for signs and symptoms of bleeding (abnormal bruising, prolonged bleeding, nose bleeds, bleeding from gums, discolored urine, black tarry stools). If you have fallen and hit your head OR if your bleeding is severe or not stopping, seek emergency care.  -Go to the emergency room if emergent signs and symptoms of new clot occur (new or worse swelling and pain in an arm or leg, shortness of breath, chest pain, fast or irregular heartbeats, lightheadedness, dizziness, fainting, coughing up blood) or if you experience a significant color change (pale or blue) in the extremity that has the DVT.  -We recommend you wear compression stockings as long as you are having swelling or pain. Be sure to purchase the correct size and take them off at night.   Your next visit is on Tuesday May 28 at [facility] D[facility] [address] [place], [place], [place] [address] Enter the hospital through Entrance C off [facility] and pull up to the Heart & Vascular Center entrance to the free valet parking.  Check in for your appointment at the Heart & Vascular Center.   If you have any questions or need to reschedule an appointment, please call [phone] [facility].  If you are having an emergency, call 911 or present to the nearest emergency room.   What is a DVT?   -Deep vein thrombosis (DVT) is a condition in which a blood clot forms in a vein of the deep venous system which can occur in the lower leg, thigh, pelvis, arm, or neck. This condition is serious and can be life-threatening if the clot travels to the arteries of the lungs and causing a blockage (pulmonary embolism, PE). A DVT can also damage veins in the leg, which can lead to long-term venous disease, leg pain, swelling, discoloration, and ulcers or sores (post-thrombotic syndrome).  -Treatment may include taking an anticoagulant medication to prevent more clots from forming and the current clot from growing, wearing compression stockings, and/or surgical procedures to remove or dissolve the clot.

## 2022-07-09 ENCOUNTER — Other Ambulatory Visit (HOSPITAL_COMMUNITY): Payer: Self-pay

## 2022-07-11 ENCOUNTER — Encounter: Payer: Self-pay | Admitting: Cardiology

## 2022-07-11 NOTE — Assessment & Plan Note (Signed)
Significant elevated Coronary Calcium Score with nonobstructive CAD by cath in 2017.  He is very active and exercises vigorously with no active symptoms.  I do not see any reason for doing any more ischemic evaluations.  I think the main course of action here would be to continue to treat medically. I personally reviewed his cath films and do agree there is nonobstructive disease. Ischemic evaluation in the absence of symptoms is none warranted as medical management should be the first plan of action. If he were to have symptoms concerning for angina, would probably consider going straight to cardiac catheterization as opposed to noninvasive evaluation.  Coronary Calcium Score 1676 may make a Coronary CTA difficult to interpret, and previous stress test was falsely positive.  Perhaps a stress PET would be reasonable option, if symptoms are equivocal.  However, if there is concern I would probably consider cardiac catheterization as a first choice.  Plan: Risk factor modification-BP already well-controlled.  Nondiabetic.  Non-smoker. LDL goal should be less than 55 based on the significant area of calcium scoring =>  will increase atorvastatin to 40 mg daily-PCP should be following labs this fall. Would like for her to be on aspirin, but this been issues with GI standpoint.   Asked that he discuss with his gastroenterologist (Dr. Loreta Ave) to see if he will be okay to start aspirin.

## 2022-07-11 NOTE — Assessment & Plan Note (Signed)
Most recent lipid panel showed LDL of 68.  Would like to try to drive the LDL down below 55 based on how high the Coronary Calcium Score is.  Plan: Increase atorvastatin to 40 mg daily and continue co-Q10.

## 2022-07-11 NOTE — Assessment & Plan Note (Signed)
False positive GXT evaluated with cardiac catheterization back in 2017 showing nonischemic CAD. Severely elevated Coronary Calcium Score makes Coronary CTA less than favorable.  Per borderline symptoms if ischemic evaluation is warranted, would probably choose either Lexiscan Myoview or stress PET, however her symptoms are concerning for angina, would proceed with cardiac catheterization first.

## 2022-07-11 NOTE — Assessment & Plan Note (Signed)
Is using testosterone gel as well as Cialis. Managed by urology.  As long as he is not shooting for supranormal levels of testosterone, should be okay.  However there is a potential increased risk for VTE.

## 2022-07-12 ENCOUNTER — Telehealth: Payer: Self-pay | Admitting: Hematology and Oncology

## 2022-07-12 ENCOUNTER — Telehealth (HOSPITAL_COMMUNITY): Payer: Self-pay | Admitting: Student-PharmD

## 2022-07-12 MED ORDER — APIXABAN 5 MG PO TABS: 5.0000 mg | ORAL_TABLET | Freq: Two times a day (BID) | ORAL | 5 refills | Status: DC

## 2022-07-12 NOTE — Telephone Encounter (Signed)
scheduled per referral, pt has been called and confirmed date and time. Pt is aware of location and to arrive early for check in   

## 2022-07-12 NOTE — Telephone Encounter (Signed)
Called patient to inform him that prior authorization for Eliquis was approved. I have sent in refills to his [employer]. It appears he will have to pay through his deductible then his copay will kick in, which he is aware of and is okay with paying. Will follow up with him at next appt to ensure continued affordability with refills.

## 2022-07-22 NOTE — Progress Notes (Signed)
[facility] [phone]   [phone]  INITIAL CONSULT NOTE  Patient Care Team: Emilio Aspen, MD as PCP - General (Internal Medicine) Marykay Lex, MD as PCP - Cardiology (Cardiology)  Hematological/Oncological History 09/28/2015: Diagnosed with LLE DVT provoked by prolonged hospitalization after fall with head injury.Treated with Eliquis x 6 months.  07/08/2022: Underwent doppler US after presented with leg tightness x 2 days. Findings were consistent with acute DVT involving the left popliteal vein, left posterior tibial veins and left peroneal veins. Started Eliquis therapy. 07/23/2022: Establish care with [facility]  CHIEF COMPLAINTS/PURPOSE OF CONSULTATION:  "Recurrent LLE DVT "  HISTORY OF PRESENTING ILLNESS:  Dwayne [facility] 77 y.o. male with medical history significant for testicular cancer status post orchiectomy, hyperlipidemia, GERD with Barrett's esophagus, hiatal hernia and BPH presents to the hematology clinic for recurrent DVT.  He is accompanied by his wife for this visit.  On exam today, Dwayne Harper reports his tightness has resolved in his left calf and swelling has greatly improved.  He is tolerating Eliquis therapy without any bruising or bleeding episodes.  He is undergoing GI evaluation for dysphagia and weight loss in the setting of esophagitis with Barrett's esophagus.  He was planning to undergo endoscopy but had to cancel the procedure due to recent DVT diagnosis.  He has no other complaints.  He reports his energy is stable and he tries to exercise regularly.  He denies fevers, chills, night sweats, shortness of breath, chest pain or cough.  He has no other complaints.  Rest of the ROS is below. MEDICAL HISTORY:  Past Medical History:  Diagnosis Date   Asthma    Barrett esophagus    BPH (benign prostatic hyperplasia)    DVT (deep venous thrombosis) (HCC) 09/2015   LLE   ED (erectile dysfunction)    GERD  (gastroesophageal reflux disease)    Hiatal hernia    endoscopy    History of gout    Hyperlipidemia 11/08/2015   Pneumonia 1950s   Scalp hematoma    Syncope 09/26/2015   Testicular cancer (HCC) 1997   orchiectomy   Tubular adenoma     SURGICAL HISTORY: Past Surgical History:  Procedure Laterality Date   CARDIAC CATHETERIZATION N/A 11/07/2015   Procedure: Left Heart Cath and Coronary Angiography;  Surgeon: Kathleene Hazel, MD;  Location: [facility] INVASIVE CV LAB;  Service: CV:  p-mCx ~60-65%, Ost OM1 ~65-70%. mCX 40%. Ost-mid LAD 30%, Ost D1 ~60%, Mild RCA plaque.  - Non-obstructive CAD (NOT likely related to syncope).   CATARACT EXTRACTION, BILATERAL     COLONOSCOPY  10/2013   tubular adenoma   Exercise Treadmill Stress Test  11/04/2015   Significant ST depression in stage II and early recovery worrisome for ischemia. => Referred for Cardiac cath => no obstructive disease noted.  Likely false positive   LAPAROSCOPIC CHOLECYSTECTOMY SINGLE SITE WITH INTRAOPERATIVE CHOLANGIOGRAM N/A 08/23/2019   Procedure: LAPAROSCOPIC CHOLECYSTECTOMY WITH TAP BLOCK BILATERAL.;  Surgeon: Karie Soda, MD;  Location: [facility] ORS;  Service: General;  Laterality: N/A;   TESTICLE REMOVAL Left 1997   cancer   TONSILLECTOMY     TRANSTHORACIC ECHOCARDIOGRAM  09/27/2015   Normal LV function with EF 60 to 65%.  No RWMA.  Normal valves.  Normal PAP.  Normal RV.   WISDOM TOOTH EXTRACTION      SOCIAL HISTORY: Social History   Socioeconomic History   Marital status: Married    Spouse name: Not on file  Number of children: Not on file   Years of education: Not on file   Highest education level: Not on file  Occupational History   Occupation: CFO    Comment: [employer]  Tobacco Use   Smoking status: Former    Years: 3    Types: Cigarettes    Quit date: 12/08/1971    Years since quitting: 50.6   Smokeless tobacco: Never   Tobacco comments:    "quit before 1975"  Vaping Use   Vaping Use: Never used   Substance and Sexual Activity   Alcohol use: Yes    Alcohol/week: 3.0 standard drinks of alcohol    Types: 2 Glasses of wine, 1 Cans of beer per week    Comment: 2-3 drinks/week   Drug use: No   Sexual activity: Not Currently  Other Topics Concern   Not on file  Social History Narrative   Pt lives in [place] with spouse.  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  Wife is endowed chair at [employer] in [employer].   Social Determinants of Health   Financial Resource Strain: Not on file  Food Insecurity: Not on file  Transportation Needs: Not on file  Physical Activity: Not on file  Stress: Not on file  Social Connections: Not on file  Intimate Partner Violence: Not on file    FAMILY HISTORY: Family History  Problem Relation Age of Onset   CAD Mother 70   Heart attack Mother    Atrial fibrillation Mother    Hypertension Mother    Heart disease Mother    Dementia Mother    Coronary artery disease Father    Aneurysm Father    Hypertension Father    Prostate cancer Father    Deep vein thrombosis Father 7       IVC filter   Deep vein thrombosis Daughter        during pregnancy    ALLERGIES:  is allergic to lactose intolerance (gi), penicillins, and oxycodone.  MEDICATIONS:  Current Outpatient Medications  Medication Sig Dispense Refill   apixaban (ELIQUIS) 5 MG TABS tablet Take 1 tablet (5 mg total) by mouth 2 (two) times daily. Start taking after completion of starter pack. 60 tablet 5   APIXABAN (ELIQUIS) VTE STARTER PACK (10MG  AND 5MG ) Take as directed on package: start with two-5mg  tablets twice daily for 7 days. On day 8, switch to one-5mg  tablet twice daily. 74 each 0   atorvastatin (LIPITOR) 40 MG tablet Take 1 tablet (40 mg total) by mouth daily. 90 tablet 3   Coenzyme Q10 (CO Q 10 PO) Take 1 tablet by mouth in the morning.     Cyanocobalamin (VITAMIN B-12 PO) Take 1 tablet by mouth daily.     fluticasone-salmeterol (ADVAIR) 100-50 MCG/ACT  AEPB Inhale 1 puff into the lungs 2 (two) times daily.     Multiple Vitamin (MULTIVITAMIN) tablet Take 1 tablet by mouth at bedtime.      omeprazole (PRILOSEC) 40 MG capsule Take 40 mg by mouth 2 (two) times daily.     OVER THE COUNTER MEDICATION Take 1 tablet by mouth in the morning and at bedtime. Focus Factor     Propylene Glycol (SYSTANE BALANCE) 0.6 % SOLN Place 1 drop into both eyes daily as needed (dry eyes).     solifenacin (VESICARE) 10 MG tablet Take 10 mg by mouth daily.      tadalafil (CIALIS) 20 MG tablet Take 20 mg by mouth  daily as needed for erectile dysfunction.      tamsulosin (FLOMAX) 0.4 MG CAPS capsule Take 0.4 mg by mouth daily after breakfast.      Testosterone 1.62 % GEL Apply 40.5 g topically daily.     acetaminophen (TYLENOL) 500 MG tablet Take 1,000 mg by mouth every 6 (six) hours as needed for mild pain. (Patient not taking: Reported on 07/23/2022)     No current facility-administered medications for this visit.    REVIEW OF SYSTEMS:   Constitutional: ( - ) fevers, ( - )  chills , ( - ) night sweats Eyes: ( - ) blurriness of vision, ( - ) double vision, ( - ) watery eyes Ears, nose, mouth, throat, and face: ( - ) mucositis, ( - ) sore throat Respiratory: ( - ) cough, ( - ) dyspnea, ( - ) wheezes Cardiovascular: ( - ) palpitation, ( - ) chest discomfort, (+ ) lower extremity swelling Gastrointestinal:  ( - ) nausea, ( - ) heartburn, ( - ) change in bowel habits Skin: ( - ) abnormal skin rashes Lymphatics: ( - ) new lymphadenopathy, ( - ) easy bruising Neurological: ( - ) numbness, ( - ) tingling, ( - ) new weaknesses Behavioral/Psych: ( - ) mood change, ( - ) new changes  All other systems were reviewed with the patient and are negative.  PHYSICAL EXAMINATION: ECOG PERFORMANCE STATUS: 1 - Symptomatic but completely ambulatory  Vitals:   07/23/22 0922  BP: 113/65  Pulse: 70  Resp: 16  Temp: 98.5 F (36.9 C)  SpO2: 97%   Filed Weights   07/23/22 0922   Weight: 154 lb 8 oz (70.1 kg)    GENERAL: well appearing male in NAD  SKIN: skin color, texture, turgor are normal, no rashes or significant lesions EYES: conjunctiva are pink and non-injected, sclera clear LUNGS: clear to auscultation and percussion with normal breathing effort HEART: regular rate & rhythm and no murmurs. Mild left lower extremity edema, nontender without erythema  Musculoskeletal: no cyanosis of digits and no clubbing  PSYCH: alert & oriented x 3, fluent speech NEURO: no focal motor/sensory deficits  LABORATORY DATA:  I have reviewed the data as listed    Latest Ref Rng & Units 08/25/2019    3:35 AM 08/24/2019    3:22 AM 08/22/2019   10:21 PM  CBC  WBC 4.0 - 10.5 K/uL 9.9  7.8  9.6   Hemoglobin 13.0 - 17.0 g/dL 29.5  62.1  30.8   Hematocrit 39.0 - 52.0 % 31.4  32.8  36.2   Platelets 150 - 400 K/uL 168  126  131        Latest Ref Rng & Units 08/25/2019    3:35 AM 08/24/2019    3:22 AM 08/22/2019   10:21 PM  CMP  Glucose 70 - 99 mg/dL 657  846  962   BUN 8 - 23 mg/dL 20  14  25    Creatinine 0.61 - 1.24 mg/dL 9.52  8.41  3.24   Sodium 135 - 145 mmol/L 132  133  132   Potassium 3.5 - 5.1 mmol/L 3.7  4.3  3.5   Chloride 98 - 111 mmol/L 101  102  97   CO2 22 - 32 mmol/L 25  22  22    Calcium 8.9 - 10.3 mg/dL 7.4  7.8  7.9   Total Protein 6.5 - 8.1 g/dL 5.4  5.8  6.6   Total Bilirubin 0.3 - 1.2 mg/dL 0.6  0.6  0.8   Alkaline Phos 38 - 126 U/L 68  79  70   AST 15 - 41 U/L 80  167  46   ALT 0 - 44 U/L 113  155  59     RADIOGRAPHIC STUDIES: I have personally reviewed the radiological images as listed and agreed with the findings in the report. VAS [place] LOWER EXTREMITY VENOUS (DVT)  Result Date: 07/08/2022  Lower Venous DVT Study Patient Name:  Dwayne [facility]  Date of Exam:   07/08/2022 Medical Rec #: [id number]               Accession #:    [id number] Date of Birth: 01/05/1946               Patient Gender: M Patient Age:   46 years Exam Location:  [facility] Procedure:      VAS [place] LOWER EXTREMITY VENOUS (DVT) Referring Phys: Christiane Ha HENDERSON --------------------------------------------------------------------------------  Indications: Pain, Swelling, and for a few days.  Comparison Study: No recent prior studies. Performing Technologist: Marilynne Halsted RDMS, RVT  Examination Guidelines: A complete evaluation includes B-mode imaging, spectral Doppler, color Doppler, and power Doppler as needed of all accessible portions of each vessel. Bilateral testing is considered an integral part of a complete examination. Limited examinations for reoccurring indications may be performed as noted. The reflux portion of the exam is performed with the patient in reverse Trendelenburg.  +-----+---------------+---------+-----------+----------+--------------+ RIGHTCompressibilityPhasicitySpontaneityPropertiesThrombus Aging +-----+---------------+---------+-----------+----------+--------------+ CFV  Full           Yes      Yes                                 +-----+---------------+---------+-----------+----------+--------------+ SFJ  Full                                                        +-----+---------------+---------+-----------+----------+--------------+   +---------+---------------+---------+-----------+----------+--------------+ LEFT     CompressibilityPhasicitySpontaneityPropertiesThrombus Aging +---------+---------------+---------+-----------+----------+--------------+ CFV      Full           Yes      Yes                                 +---------+---------------+---------+-----------+----------+--------------+ SFJ      Full                                                        +---------+---------------+---------+-----------+----------+--------------+ FV Prox  Full                                                        +---------+---------------+---------+-----------+----------+--------------+ FV Mid   Full                                                         +---------+---------------+---------+-----------+----------+--------------+  FV DistalFull                                                        +---------+---------------+---------+-----------+----------+--------------+ PFV      Full                                                        +---------+---------------+---------+-----------+----------+--------------+ POP      None           No       No                   Acute          +---------+---------------+---------+-----------+----------+--------------+ PTV      None                                         Acute          +---------+---------------+---------+-----------+----------+--------------+ PERO     None                                         Acute          +---------+---------------+---------+-----------+----------+--------------+    Summary: RIGHT: - No evidence of common femoral vein obstruction.  LEFT: - Findings consistent with acute deep vein thrombosis involving the left popliteal vein, left posterior tibial veins, and left peroneal veins.  *See table(s) above for measurements and observations. Electronically signed by Gerarda Fraction on 07/08/2022 at 5:24:05 PM.    Final    CT CARDIAC SCORING (SELF PAY ONLY)  Addendum Date: 07/05/2022   ADDENDUM REPORT: 07/05/2022 01:08 EXAM: OVER-READ INTERPRETATION  CT CHEST The following report is an over-read performed by radiologist Dr. Alcide Clever of [facility] on 07/05/2022. This over-read does not include interpretation of cardiac or coronary anatomy or pathology. The coronary calcium score interpretation by the cardiologist is attached. COMPARISON:  None. FINDINGS: Cardiovascular: There are no significant extracardiac vascular findings. Mediastinum/Nodes: There are no enlarged lymph nodes within the visualized mediastinum. Lungs/Pleura: There is no pleural effusion. The visualized lungs appear clear. Upper  abdomen: No significant findings in the visualized upper abdomen. Musculoskeletal/Chest wall: No chest wall mass or suspicious osseous findings within the visualized chest. IMPRESSION: No significant extracardiac findings within the visualized chest. Electronically Signed   By: Alcide Clever M.D.   On: 07/05/2022 01:08   Result Date: 07/05/2022 CLINICAL DATA:  Risk stratification EXAM: Coronary Calcium Score TECHNIQUE: The patient was scanned on a Siemens Somatom 64 slice scanner. Axial non-contrast 3mm slices were carried out through the heart. The data set was analyzed on a dedicated work station and scored using the Agatson method. FINDINGS: Non-cardiac: No significant non cardiac findings on limited lung and soft tissue windows. See separate report from [facility]. Ascending Aorta: Normal diameter 3.2 cm Pericardium: Normal Coronary arteries: Severe 3 vessel coronary calcium LM 0 LAD 679 LCX 377 RCA 622 Total 1676 IMPRESSION: Coronary calcium score of 1676. This  was 50 th percentile for age and sex matched control. Consider f/u perfusion study given how high score is Charlton Haws Electronically Signed: By: Charlton Haws M.D. On: 07/01/2022 08:16    ASSESSMENT & PLAN Dwayne Harper is a 77 y.o. male who presents to the hematology clinic for evaluation of recurrent DVT.   We reviewed potential etiologies that can provoke venous thromboembolism (VTE) including prolonged travel/immobility, surgery (particular abdominal or orthropedic), trauma,  and pregnancy/ estrogen containing birth control. After a detailed history and review of the records there is no clear provoking factor for this patient's VTE.  Patients with unprovoked VTEs have up to 25% recurrence after 5 years and 36% at 10 years, with 4% of these clots being fatal ([phone]). Therefore the formal recommendation for unprovoked VTE's is lifelong anticoagulation, as the cause may not be transient or reversible. We recommend  6 months or full strength anticoagulation with a re-evaluation after that time.  The patient's will then have a choice of maintenance dose DOAC (preferred, recommended), 81mg  ASA PO daily (non-preferred), or no further anticoagulation (not recommended).   #Unprovoked LLE DVT #Recurrent DVT: --Most recent episode is most consistent with a unprovoked VTE --will order baseline CMP and CBC to assure labs are adequate for DOAC therapy --Will check for factor V Leiden and prothrombin gene mutations due to family history of DVTs.  In addition we will check beta-2 glycoprotein antibodies and cardiolipin antibodies to evaluate for antiphospholipid syndrome. --recommend the patient continue eliquis 5mg  BID --patient denies any bleeding, bruising, or dark stools on this medication. It is well tolerated. No difficulties accessing/affording the medication --RTC in 6 months' time with strict return precautions for overt signs of bleeding.   #Esophagitis/Barrett's esophagus: -- Patient requires an endoscopy for ongoing dysphagia with weight loss. -- Generally, we don't recommend anticoagulation interruption for minimum of 3 months. However, if patient's gastroenterologist requires an EGD urgently, we may recommend holding Eliquis for 48 hours before the procedure and bridging with Lovenox every 12 hours.  He will need to hold the Lovenox 12 hours before the procedure and can resume Eliquis 24 hours after procedure.    Orders Placed This Encounter  Procedures   CBC with Differential ([facility] Only)    Standing Status:   Future    Standing Expiration Date:   07/23/2023   CMP ([facility] only)    Standing Status:   Future    Standing Expiration Date:   07/23/2023   Cardiolipin antibodies, IgG, IgM, IgA*    Standing Status:   Future    Standing Expiration Date:   07/23/2023   Beta-2-glycoprotein i abs, IgG/M/A    Standing Status:   Future    Standing Expiration Date:   07/23/2023   Factor 5 Leiden*     Standing Status:   Future    Standing Expiration Date:   07/23/2023   Prothrombin gene mutation*    Standing Status:   Future    Standing Expiration Date:   07/23/2023    All questions were answered. The patient knows to call the clinic with any problems, questions or concerns.  I have spent a total of 60 minutes minutes of face-to-face and non-face-to-face time, preparing to see the patient, obtaining and/or reviewing separately obtained history, performing a medically appropriate examination, counseling and educating the patient, ordering tests/procedures, documenting clinical information in the electronic health record, and care coordination.   Dwayne Kaufmann, PA-C Department of Hematology/Oncology [facility] Phone: [phone]  Patient was seen with Dr. Leonides Schanz.  I have read the above note and personally examined the patient. I agree with the assessment and plan as noted above.  Briefly Dwayne [facility] is a 77 year old male who presents for evaluation of recurrent VTE.  He has a history of DVTs with the most recent one being unprovoked.  Given the unprovoked nature of the last blood clot would recommend continuing Eliquis 5 mg twice daily.  After the patient completed 6 months of total therapy we can transition to a maintenance dose 2.5 mg twice daily.  I would recommend indefinite anticoagulation.  The patient voices understanding of our findings and the plan moving forward.   Dwayne Barns, MD Department of Hematology/Oncology [facility] Phone: [phone] Pager: [phone] Email: [email or web address]

## 2022-07-23 ENCOUNTER — Encounter: Payer: Self-pay | Admitting: Physician Assistant

## 2022-07-23 ENCOUNTER — Inpatient Hospital Stay: Payer: Medicare Other

## 2022-07-23 ENCOUNTER — Inpatient Hospital Stay: Payer: Medicare Other | Attending: Physician Assistant | Admitting: Physician Assistant

## 2022-07-23 VITALS — BP 113/65 | HR 70 | Temp 98.5°F | Resp 16 | Ht 66.93 in | Wt 154.5 lb

## 2022-07-23 DIAGNOSIS — I82452 Acute embolism and thrombosis of left peroneal vein: Secondary | ICD-10-CM | POA: Diagnosis not present

## 2022-07-23 DIAGNOSIS — I82442 Acute embolism and thrombosis of left tibial vein: Secondary | ICD-10-CM | POA: Diagnosis not present

## 2022-07-23 DIAGNOSIS — Z8547 Personal history of malignant neoplasm of testis: Secondary | ICD-10-CM | POA: Diagnosis not present

## 2022-07-23 DIAGNOSIS — Z79899 Other long term (current) drug therapy: Secondary | ICD-10-CM | POA: Diagnosis not present

## 2022-07-23 DIAGNOSIS — I82432 Acute embolism and thrombosis of left popliteal vein: Secondary | ICD-10-CM | POA: Diagnosis present

## 2022-07-23 DIAGNOSIS — Z87891 Personal history of nicotine dependence: Secondary | ICD-10-CM | POA: Diagnosis not present

## 2022-07-23 DIAGNOSIS — Z7901 Long term (current) use of anticoagulants: Secondary | ICD-10-CM | POA: Diagnosis not present

## 2022-07-23 DIAGNOSIS — Z8042 Family history of malignant neoplasm of prostate: Secondary | ICD-10-CM

## 2022-07-23 DIAGNOSIS — K219 Gastro-esophageal reflux disease without esophagitis: Secondary | ICD-10-CM | POA: Diagnosis not present

## 2022-07-23 LAB — CBC WITH DIFFERENTIAL (CANCER CENTER ONLY)
Abs Immature Granulocytes: 0.02 10*3/uL (ref 0.00–0.07)
Basophils Absolute: 0 10*3/uL (ref 0.0–0.1)
Basophils Relative: 1 %
Eosinophils Absolute: 0.2 10*3/uL (ref 0.0–0.5)
Eosinophils Relative: 3 %
HCT: 42.6 % (ref 39.0–52.0)
Hemoglobin: 15 g/dL (ref 13.0–17.0)
Immature Granulocytes: 0 %
Lymphocytes Relative: 18 %
Lymphs Abs: 1.1 10*3/uL (ref 0.7–4.0)
MCH: 32.5 pg (ref 26.0–34.0)
MCHC: 35.2 g/dL (ref 30.0–36.0)
MCV: 92.4 fL (ref 80.0–100.0)
Monocytes Absolute: 0.5 10*3/uL (ref 0.1–1.0)
Monocytes Relative: 9 %
Neutro Abs: 4.2 10*3/uL (ref 1.7–7.7)
Neutrophils Relative %: 69 %
Platelet Count: 180 10*3/uL (ref 150–400)
RBC: 4.61 MIL/uL (ref 4.22–5.81)
RDW: 12.1 % (ref 11.5–15.5)
WBC Count: 6 10*3/uL (ref 4.0–10.5)
nRBC: 0 % (ref 0.0–0.2)

## 2022-07-23 LAB — CMP (CANCER CENTER ONLY)
ALT: 18 U/L (ref 0–44)
AST: 22 U/L (ref 15–41)
Albumin: 4 g/dL (ref 3.5–5.0)
Alkaline Phosphatase: 75 U/L (ref 38–126)
Anion gap: 5 (ref 5–15)
BUN: 17 mg/dL (ref 8–23)
CO2: 31 mmol/L (ref 22–32)
Calcium: 9.1 mg/dL (ref 8.9–10.3)
Chloride: 102 mmol/L (ref 98–111)
Creatinine: 0.98 mg/dL (ref 0.61–1.24)
GFR, Estimated: >60 mL/min (ref >60)
Glucose, Bld: 109 mg/dL — ABNORMAL HIGH (ref 70–99)
Potassium: 4.6 mmol/L (ref 3.5–5.1)
Sodium: 138 mmol/L (ref 135–145)
Total Bilirubin: 0.5 mg/dL (ref 0.3–1.2)
Total Protein: 6.9 g/dL (ref 6.5–8.1)

## 2022-07-24 LAB — BETA-2-GLYCOPROTEIN I ABS, IGG/M/A
Beta-2 Glyco I IgG: <9 GPI IgG units (ref 0–20)
Beta-2-Glycoprotein I IgA: <9 GPI IgA units (ref 0–25)
Beta-2-Glycoprotein I IgM: 21 GPI IgM units (ref 0–32)

## 2022-07-27 LAB — CARDIOLIPIN ANTIBODIES, IGG, IGM, IGA
Anticardiolipin IgA: <9 APL U/mL (ref 0–11)
Anticardiolipin IgG: <9 GPL U/mL (ref 0–14)
Anticardiolipin IgM: 9 MPL U/mL (ref 0–12)

## 2022-07-29 ENCOUNTER — Encounter: Payer: Self-pay | Admitting: Physician Assistant

## 2022-08-03 ENCOUNTER — Ambulatory Visit (HOSPITAL_COMMUNITY)
Admission: RE | Admit: 2022-08-03 | Discharge: 2022-08-03 | Disposition: A | Discharge status: Home / Self Care | Payer: Medicare Other | Source: Ambulatory Visit | Attending: Vascular Surgery | Admitting: Vascular Surgery

## 2022-08-03 ENCOUNTER — Telehealth: Payer: Self-pay | Admitting: *Deleted

## 2022-08-03 ENCOUNTER — Encounter (HOSPITAL_COMMUNITY): Payer: Self-pay

## 2022-08-03 VITALS — BP 112/69 | HR 66

## 2022-08-03 DIAGNOSIS — I82432 Acute embolism and thrombosis of left popliteal vein: Secondary | ICD-10-CM | POA: Insufficient documentation

## 2022-08-03 NOTE — Progress Notes (Signed)
DVT Clinic Note  Name: Dwayne Harper     MRN: [id number]     DOB: Feb 14, 1946     Sex: male  PCP: Emilio Aspen, MD  Today's Visit: Visit Information: Follow Up Visit  Referred to DVT Clinic by: Dr. Orson Aloe (PCP at [facility])   Referred to CPP by: Dr. Randie Heinz Reason for referral:  Chief Complaint  Patient presents with   Med Management - DVT   HISTORY OF PRESENT ILLNESS:  Dwayne Harper is a 77 y.o. 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 Last seen in DVT Clinic 07/08/22 at which time Eliquis was started. No provoking factors identified, so he was seen by hematology 07/23/22 who recommend 6 months of full dose anticoagulation followed by maintenance dose Eliquis indefinitely.   Today patient reports that his symptoms of pain and tingling resolved within 3 days of starting Eliquis. Denies abnormal bleeding or bruising. Denies missed doses of Eliquis. Has been wearing compression stockings and has multiple pairs to bring on his upcoming trip to [place]. He has continued to work out at [employer] without issue.   Positive Thrombotic Risk Factors: Previous VTE, Older Age, Testosterone therapy Bleeding Risk Factors: Age >65 years, Anticoagulant therapy  Negative Thrombotic Risk Factors: Recent surgery (within 3 months), Recent trauma (within 3 months), Recent admission to hospital with acute illness (within 3 months), Paralysis, paresis, or recent plaster cast immobilization of lower extremity, Bed rest >72 hours within 3 months, Sedentary journey lasting >8 hours within 4 weeks, Central venous catheterization, Pregnancy, Estrogen therapy, Active cancer,  Smoking, Obesity, Known thrombophilic condition, Recent COVID diagnosis (within 3 months), Recent cesarean section (within 3 months), Within 6 weeks postpartum, Erythropoiesis-stimulating agent, Non-malignant, chronic inflammatory condition  Rx Insurance Coverage: Medicare - he will switch to the [facility] August 1st of this year. Rx Affordability: Used one time $0 copay card at last visit to fill the Eliquis starter pack. He has a deductible he will have to pay through on his next refill, which he is comfortable paying. We will then be able to tell what his copay is.  Preferred Pharmacy: Refills have been sent to [facility]  Past Medical History:  Diagnosis Date   Asthma    Barrett esophagus    BPH (benign prostatic hyperplasia)    DVT (deep venous thrombosis) (HCC) 09/2015   LLE   ED (erectile dysfunction)    GERD (gastroesophageal reflux disease)    Hiatal hernia    endoscopy    History of gout    Hyperlipidemia 11/08/2015   Pneumonia 1950s   Scalp hematoma    Syncope 09/26/2015   Testicular cancer [facility]) 1997   orchiectomy   Tubular adenoma     Past Surgical History:  Procedure Laterality Date   CARDIAC CATHETERIZATION N/A 11/07/2015   Procedure: Left Heart Cath and Coronary Angiography;  Surgeon: Kathleene Hazel, MD;  Location: [facility] INVASIVE CV LAB;  Service: CV:  p-mCx ~60-65%, Ost OM1 ~65-70%. mCX 40%. Ost-mid LAD 30%, Ost D1 ~60%, Mild RCA plaque.  - Non-obstructive CAD (NOT likely related to syncope).   CATARACT EXTRACTION, BILATERAL     COLONOSCOPY  10/2013   tubular adenoma   Exercise  Treadmill Stress Test  11/04/2015   Significant ST depression in stage II and early recovery worrisome for ischemia. => Referred for Cardiac cath => no obstructive disease noted.  Likely false positive   LAPAROSCOPIC CHOLECYSTECTOMY SINGLE SITE WITH INTRAOPERATIVE CHOLANGIOGRAM N/A 08/23/2019   Procedure: LAPAROSCOPIC CHOLECYSTECTOMY WITH TAP BLOCK BILATERAL.;  Surgeon:  Karie Soda, MD;  Location: [facility] ORS;  Service: General;  Laterality: N/A;   TESTICLE REMOVAL Left 1997   cancer   TONSILLECTOMY     TRANSTHORACIC ECHOCARDIOGRAM  09/27/2015   Normal LV function with EF 60 to 65%.  No RWMA.  Normal valves.  Normal PAP.  Normal RV.   WISDOM TOOTH EXTRACTION      Social History   Socioeconomic History   Marital status: Married    Spouse name: Not on file   Number of children: Not on file   Years of education: Not on file   Highest education level: Not on file  Occupational History   Occupation: CFO    Comment: [employer]  Tobacco Use   Smoking status: Former    Years: 3    Types: Cigarettes    Quit date: 12/08/1971    Years since quitting: 50.6   Smokeless tobacco: Never   Tobacco comments:    "quit before 1975"  Vaping Use   Vaping Use: Never used  Substance and Sexual Activity   Alcohol use: Yes    Alcohol/week: 3.0 standard drinks of alcohol    Types: 2 Glasses of wine, 1 Cans of beer per week    Comment: 2-3 drinks/week   Drug use: No   Sexual activity: Not Currently  Other Topics Concern   Not on file  Social History Narrative   Pt lives in [place] with spouse.  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  Wife is endowed chair at [employer] in [employer].   Social Determinants of Health   Financial Resource Strain: Not on file  Food Insecurity: Not on file  Transportation Needs: Not on file  Physical Activity: Not on file  Stress: Not on file  Social Connections: Not on file  Intimate Partner Violence: Not on file    Family History  Problem Relation Age of Onset   CAD Mother 65   Heart attack Mother    Atrial fibrillation Mother    Hypertension Mother    Heart disease Mother    Dementia Mother    Coronary artery disease Father    Aneurysm Father    Hypertension Father    Prostate cancer Father    Deep vein thrombosis Father 22       IVC filter   Deep vein thrombosis Daughter        during pregnancy     Allergies as of 08/03/2022 - Review Complete 08/03/2022  Allergen Reaction Noted   Lactose intolerance (gi)  09/26/2015   Penicillins Other (See Comments)    Oxycodone Other (See Comments) 08/23/2019    Current Outpatient Medications on File Prior to Encounter  Medication Sig Dispense Refill   apixaban (ELIQUIS) 5 MG TABS tablet Take 1 tablet (5 mg total) by mouth 2 (two) times daily. Start taking after completion of starter pack. 60 tablet 5   atorvastatin (LIPITOR) 40 MG tablet Take 1 tablet (40 mg total) by mouth daily. 90 tablet 3   Coenzyme Q10 (CO Q 10 PO) Take 1 tablet by mouth in the morning.     Cyanocobalamin (VITAMIN B-12 PO) Take 1  tablet by mouth daily.     fluticasone-salmeterol (ADVAIR) 100-50 MCG/ACT AEPB Inhale 1 puff into the lungs 2 (two) times daily.     Multiple Vitamin (MULTIVITAMIN) tablet Take 1 tablet by mouth at bedtime.      omeprazole (PRILOSEC) 40 MG capsule Take 40 mg by mouth 2 (two) times daily.     OVER THE COUNTER MEDICATION Take 1 tablet by mouth in the morning and at bedtime. Focus Factor     Propylene Glycol (SYSTANE BALANCE) 0.6 % SOLN Place 1 drop into both eyes daily as needed (dry eyes).     solifenacin (VESICARE) 10 MG tablet Take 10 mg by mouth daily.      tadalafil (CIALIS) 20 MG tablet Take 20 mg by mouth daily as needed for erectile dysfunction.      tamsulosin (FLOMAX) 0.4 MG CAPS capsule Take 0.4 mg by mouth daily after breakfast.      Testosterone 1.62 % GEL Apply 40.5 g topically daily.     No current facility-administered medications on file prior to encounter.   REVIEW OF SYSTEMS:  Review of Systems  Respiratory:  Negative for shortness of breath.   Cardiovascular:  Negative for chest pain, palpitations and leg swelling.  Musculoskeletal:  Negative for myalgias.  Neurological:  Negative for dizziness and tingling.   PHYSICAL EXAMINATION:  Vitals:   08/03/22 0918  BP: 112/69  Pulse: 66  SpO2: 100%   Physical Exam Vitals  reviewed.  Cardiovascular:     Rate and Rhythm: Normal rate.  Pulmonary:     Effort: Pulmonary effort is normal.  Musculoskeletal:        General: No tenderness.     Right lower leg: No edema.     Left lower leg: Edema (1+) present.  Skin:    Findings: No bruising or erythema.   Villalta Score for Post-Thrombotic Syndrome: Pain: Absent Cramps: Absent Heaviness: Absent Paresthesia: Absent Pruritus: Absent Pretibial Edema: Mild Skin Induration: Absent Hyperpigmentation: Absent Redness: Absent Venous Ectasia: Absent Pain on calf compression: Absent Villalta Preliminary Score: 1 Is venous ulcer present?: No If venous ulcer is present and score is <15, then 15 points total are assigned: Absent Villalta Total Score: 1  LABS:  CBC     Component Value Date/Time   WBC 6.0 07/23/2022 1030   WBC 9.9 08/25/2019 0335   RBC 4.61 07/23/2022 1030   HGB 15.0 07/23/2022 1030   HCT 42.6 07/23/2022 1030   PLT 180 07/23/2022 1030   MCV 92.4 07/23/2022 1030   MCH 32.5 07/23/2022 1030   MCHC 35.2 07/23/2022 1030   RDW 12.1 07/23/2022 1030   LYMPHSABS 1.1 07/23/2022 1030   MONOABS 0.5 07/23/2022 1030   EOSABS 0.2 07/23/2022 1030   BASOSABS 0.0 07/23/2022 1030    Hepatic Function      Component Value Date/Time   PROT 6.9 07/23/2022 1030   ALBUMIN 4.0 07/23/2022 1030   AST 22 07/23/2022 1030   ALT 18 07/23/2022 1030   ALKPHOS 75 07/23/2022 1030   BILITOT 0.5 07/23/2022 1030   BILIDIR 0.1 12/08/2015 1606   IBILI 0.5 12/08/2015 1606    Renal Function   Lab Results  Component Value Date   CREATININE 0.98 07/23/2022   CREATININE 0.91 08/25/2019   CREATININE 0.83 08/24/2019    Estimated Creatinine Clearance: 61.6 mL/min (by C-G formula based on SCr of 0.98 mg/dL).   [facility] Vascular Lab Studies:  07/08/22 VAS [place] LOWER EXTREMITY VENOUS LEFT (DVT)  Summary:  RIGHT:  -  No evidence of common femoral vein obstruction.   LEFT:  - Findings consistent with acute deep vein thrombosis  involving the left  popliteal vein, left posterior tibial veins, and left peroneal veins.   ASSESSMENT: Location of DVT: Left popliteal vein, Left distal vein Cause of DVT: unprovoked. He has established with hematology who also did not find any provoking risk factors for this DVT. They plan to treat with full dose Eliquis for 6 months followed by maintenance dosing indefinitely to prevent future VTE. His symptoms have significantly improved on Eliquis. He continues to have mild swelling in the left leg for which he is continuing to wear compression stockings. He has upcoming travel to [place] and across the [place] this summer, and he will ensure he has adequate supply of Eliquis to bring with him and plans to wear his compression stockings while traveling.   PLAN: -Continue apixaban (Eliquis) 5 mg twice daily. -Expected duration of therapy: Indefinite. Therapy started on 07/08/22. -Patient educated on purpose, proper use and potential adverse effects of apixaban (Eliquis). -Discussed importance of taking medication around the same time every day. -Advised patient of medications to avoid (NSAIDs, aspirin doses >100 mg daily). -Educated that Tylenol (acetaminophen) is the preferred analgesic to lower the risk of bleeding. -Advised patient to alert all providers of anticoagulation therapy prior to starting a new medication or having a procedure. -Emphasized importance of monitoring for signs and symptoms of bleeding (abnormal bruising, prolonged bleeding, nose bleeds, bleeding from gums, discolored urine, black tarry stools). -Educated patient to present to the ED if emergent signs and symptoms of new thrombosis occur. -Counseled patient to wear compression stockings daily, removing at night.  Follow up: 2 months in DVT Clinic - this will be around the time his insurance changes over so we will ensure continued coverage and affordability of Eliquis.  Pervis Hocking, PharmD, [place] [place], CPP Deep Vein  Thrombosis Clinic Clinical Pharmacist Practitioner Office: [phone]

## 2022-08-03 NOTE — Telephone Encounter (Signed)
Completed medical clearance form faxed to [facility].

## 2022-08-03 NOTE — Patient Instructions (Signed)
-  Continue apixaban (Eliquis) 5 mg twice daily. -Your refills have been sent to [facility]. I will call them today.  -It is important to take your medication around the same time every day.  -Avoid NSAIDs like ibuprofen (Advil, Motrin) and naproxen (Aleve) as well as aspirin doses over 100 mg daily. -Tylenol (acetaminophen) is the preferred over the counter pain medication to lower the risk of bleeding. -Be sure to alert all of your health care providers that you are taking an anticoagulant prior to starting a new medication or having a procedure. -Monitor for signs and symptoms of bleeding (abnormal bruising, prolonged bleeding, nose bleeds, bleeding from gums, discolored urine, black tarry stools). If you have fallen and hit your head OR if your bleeding is severe or not stopping, seek emergency care.  -Go to the emergency room if emergent signs and symptoms of new clot occur (new or worse swelling and pain in an arm or leg, shortness of breath, chest pain, fast or irregular heartbeats, lightheadedness, dizziness, fainting, coughing up blood) or if you experience a significant color change (pale or blue) in the extremity that has the DVT.  -We recommend you wear compression stockings (20-30 mmHg) as long as you are having swelling or pain. Be sure to purchase the correct size and take them off at night.   Your next visit is on Tuesday August 6th at [facility] DVT Clinic [address] [place], [place], [place] [address] Enter the hospital through Entrance C off [facility] and pull up to the [facility] Vascular Center entrance to the free valet parking.  Check in for your appointment at the Heart & Vascular Center.   If you have any questions or need to reschedule an appointment, please call [phone] [facility].  If you are having an emergency, call 911 or present to the nearest emergency room.   What is a DVT?  -Deep vein thrombosis (DVT) is a condition in which a blood clot forms  in a vein of the deep venous system which can occur in the lower leg, thigh, pelvis, arm, or neck. This condition is serious and can be life-threatening if the clot travels to the arteries of the lungs and causing a blockage (pulmonary embolism, PE). A DVT can also damage veins in the leg, which can lead to long-term venous disease, leg pain, swelling, discoloration, and ulcers or sores (post-thrombotic syndrome).  -Treatment may include taking an anticoagulant medication to prevent more clots from forming and the current clot from growing, wearing compression stockings, and/or surgical procedures to remove or dissolve the clot.

## 2022-08-04 ENCOUNTER — Telehealth: Payer: Self-pay | Admitting: *Deleted

## 2022-08-04 ENCOUNTER — Other Ambulatory Visit: Payer: Self-pay | Admitting: Hematology and Oncology

## 2022-08-04 MED ORDER — ENOXAPARIN SODIUM 80 MG/0.8ML IJ SOSY: 80.0000 mg | PREFILLED_SYRINGE | Freq: Two times a day (BID) | INTRAMUSCULAR | 0 refills | Status: DC

## 2022-08-04 NOTE — Telephone Encounter (Signed)
TCT patient regarding his upcoming trip to [place] and then having an EGD within 3 days of his return.Spoke with him.  He is on Eliquis but will need Lovenox bridge starting 2 days before his EGD and then can resume his Eliquis the evening of his EGD. He is leaving for [place] tomorrow (08/05/22) and will need to have the Lovenox available to him when he gets back on 08/27/22. Advised that Dr. Leonides Schanz will send this prescription to his [facility] pharmacy today. Reviewed his Eliquis/lovenox bridge timing with him. He voiced understanding to the above

## 2022-08-06 LAB — PROTHROMBIN GENE MUTATION

## 2022-08-13 LAB — FACTOR 5 LEIDEN

## 2022-08-27 ENCOUNTER — Telehealth: Payer: Self-pay | Admitting: *Deleted

## 2022-08-27 NOTE — Telephone Encounter (Signed)
TCT patient regarding Dr. Derek Mound response to pt c/o red splotches on his legs. Per Dr. Leonides Schanz: "Red blotches may represent a heat rash from the stockings. Recommend when he completes his travels that he removes the compression socks and keep the area dry (do no use moisturizing cream). If the rashes worsen have him give [place] a call, though I anticipate it should steadily improve. Unlikely this represents an issue with blood clots/his blood thinner."  Pt grateful for call back and voiced understanding.

## 2022-08-27 NOTE — Telephone Encounter (Signed)
Received call from patient and pt's wife. Both state they have returned from [place] and are [place]. Pt and wife state he did well on this trip. Their main concern is now red blotches on both of his legs from the knees down. He had been wearing new compression stockings and removed them as they were a bit itchy. They wanted to know if they should be concerned about the red blotches They will not be getting in to [facility] until after 7pm tonight.  Please advise

## 2022-08-30 ENCOUNTER — Other Ambulatory Visit: Payer: Medicare Other

## 2022-08-30 ENCOUNTER — Encounter: Payer: Medicare Other | Admitting: Hematology and Oncology

## 2022-10-07 ENCOUNTER — Other Ambulatory Visit: Payer: Self-pay | Admitting: Physician Assistant

## 2022-10-07 DIAGNOSIS — R221 Localized swelling, mass and lump, neck: Secondary | ICD-10-CM

## 2022-10-08 ENCOUNTER — Other Ambulatory Visit: Payer: Self-pay | Admitting: Physician Assistant

## 2022-10-08 ENCOUNTER — Ambulatory Visit
Admission: RE | Admit: 2022-10-08 | Discharge: 2022-10-08 | Disposition: A | Discharge status: Home / Self Care | Payer: Medicare Other | Source: Ambulatory Visit | Attending: Physician Assistant | Admitting: Physician Assistant

## 2022-10-08 DIAGNOSIS — R221 Localized swelling, mass and lump, neck: Secondary | ICD-10-CM

## 2022-10-12 ENCOUNTER — Other Ambulatory Visit: Payer: Medicare Other

## 2022-10-12 ENCOUNTER — Ambulatory Visit (HOSPITAL_COMMUNITY): Payer: Medicare Other

## 2022-10-13 ENCOUNTER — Telehealth: Payer: Self-pay | Admitting: Otolaryngology

## 2022-10-14 DIAGNOSIS — R590 Localized enlarged lymph nodes: Secondary | ICD-10-CM | POA: Insufficient documentation

## 2022-10-15 NOTE — Telephone Encounter (Signed)
error 

## 2022-10-20 ENCOUNTER — Other Ambulatory Visit (HOSPITAL_COMMUNITY): Payer: Self-pay | Admitting: Otolaryngology

## 2022-10-20 DIAGNOSIS — C109 Malignant neoplasm of oropharynx, unspecified: Secondary | ICD-10-CM

## 2022-10-20 DIAGNOSIS — C01 Malignant neoplasm of base of tongue: Secondary | ICD-10-CM

## 2022-10-27 ENCOUNTER — Ambulatory Visit
Admission: RE | Admit: 2022-10-27 | Discharge: 2022-10-27 | Disposition: A | Discharge status: Home / Self Care | Payer: Medicare Other | Source: Ambulatory Visit | Attending: Otolaryngology | Admitting: Otolaryngology

## 2022-10-27 DIAGNOSIS — I2510 Atherosclerotic heart disease of native coronary artery without angina pectoris: Secondary | ICD-10-CM | POA: Insufficient documentation

## 2022-10-27 DIAGNOSIS — Z8571 Personal history of Hodgkin lymphoma: Secondary | ICD-10-CM | POA: Diagnosis not present

## 2022-10-27 DIAGNOSIS — J439 Emphysema, unspecified: Secondary | ICD-10-CM | POA: Insufficient documentation

## 2022-10-27 DIAGNOSIS — C01 Malignant neoplasm of base of tongue: Secondary | ICD-10-CM | POA: Diagnosis not present

## 2022-10-27 DIAGNOSIS — Z8547 Personal history of malignant neoplasm of testis: Secondary | ICD-10-CM | POA: Diagnosis not present

## 2022-10-27 DIAGNOSIS — C109 Malignant neoplasm of oropharynx, unspecified: Secondary | ICD-10-CM | POA: Insufficient documentation

## 2022-10-27 DIAGNOSIS — I700 Atherosclerosis of aorta: Secondary | ICD-10-CM | POA: Insufficient documentation

## 2022-10-27 LAB — GLUCOSE, CAPILLARY: Glucose-Capillary: 75 mg/dL (ref 70–99)

## 2022-10-27 MED ORDER — FLUDEOXYGLUCOSE F - 18 (FDG) INJECTION
8.0000 | Freq: Once | INTRAVENOUS | Status: AC | PRN
  Administered 2022-10-27: 8.58 via INTRAVENOUS

## 2022-10-28 DIAGNOSIS — C01 Malignant neoplasm of base of tongue: Secondary | ICD-10-CM | POA: Insufficient documentation

## 2022-10-29 ENCOUNTER — Telehealth: Payer: Self-pay | Admitting: Radiation Oncology

## 2022-10-29 NOTE — Telephone Encounter (Signed)
8/23 @ 10:34 am Patient called to cancel all his appointments, due to wanting a 2nd opinion for his diagnosis.  All appointments has been canceled as requested.  Left voicemail for Dr. Lucky Rathke ref coord so they are aware.

## 2022-11-02 ENCOUNTER — Ambulatory Visit: Payer: Medicare Other | Admitting: Radiation Oncology

## 2022-11-02 ENCOUNTER — Ambulatory Visit: Payer: Medicare Other

## 2022-11-02 DIAGNOSIS — C01 Malignant neoplasm of base of tongue: Secondary | ICD-10-CM

## 2022-11-04 ENCOUNTER — Other Ambulatory Visit (HOSPITAL_COMMUNITY): Payer: Medicare Other

## 2022-11-11 ENCOUNTER — Ambulatory Visit: Payer: Medicare Other | Admitting: Hematology and Oncology

## 2022-11-11 ENCOUNTER — Other Ambulatory Visit: Payer: Medicare Other

## 2022-12-24 ENCOUNTER — Telehealth: Payer: Self-pay | Admitting: Hematology and Oncology

## 2023-01-21 ENCOUNTER — Other Ambulatory Visit: Payer: Medicare Other

## 2023-01-21 ENCOUNTER — Ambulatory Visit: Payer: Medicare Other | Admitting: Hematology and Oncology

## 2023-02-17 DIAGNOSIS — Z923 Personal history of irradiation: Secondary | ICD-10-CM | POA: Insufficient documentation

## 2023-02-17 DIAGNOSIS — K1233 Oral mucositis (ulcerative) due to radiation: Secondary | ICD-10-CM | POA: Insufficient documentation

## 2023-03-18 ENCOUNTER — Ambulatory Visit: Payer: Medicare Other | Admitting: Hematology and Oncology

## 2023-03-18 ENCOUNTER — Other Ambulatory Visit: Payer: Medicare Other

## 2023-04-19 ENCOUNTER — Encounter: Payer: Self-pay | Admitting: Internal Medicine

## 2023-04-19 ENCOUNTER — Ambulatory Visit: Payer: Medicare PPO | Admitting: Internal Medicine

## 2023-04-19 VITALS — BP 120/72 | HR 62 | Temp 97.2°F | Ht 66.93 in | Wt 141.8 lb

## 2023-04-19 DIAGNOSIS — Z86718 Personal history of other venous thrombosis and embolism: Secondary | ICD-10-CM | POA: Diagnosis not present

## 2023-04-19 DIAGNOSIS — I2510 Atherosclerotic heart disease of native coronary artery without angina pectoris: Secondary | ICD-10-CM

## 2023-04-19 DIAGNOSIS — K140 Glossitis: Secondary | ICD-10-CM

## 2023-04-19 DIAGNOSIS — C01 Malignant neoplasm of base of tongue: Secondary | ICD-10-CM

## 2023-04-19 DIAGNOSIS — Z0000 Encounter for general adult medical examination without abnormal findings: Secondary | ICD-10-CM | POA: Diagnosis not present

## 2023-04-19 DIAGNOSIS — E785 Hyperlipidemia, unspecified: Secondary | ICD-10-CM | POA: Diagnosis not present

## 2023-04-19 DIAGNOSIS — E291 Testicular hypofunction: Secondary | ICD-10-CM | POA: Diagnosis not present

## 2023-04-19 DIAGNOSIS — K209 Esophagitis, unspecified without bleeding: Secondary | ICD-10-CM

## 2023-04-19 DIAGNOSIS — N398 Other specified disorders of urinary system: Secondary | ICD-10-CM | POA: Diagnosis not present

## 2023-04-19 DIAGNOSIS — R634 Abnormal weight loss: Secondary | ICD-10-CM

## 2023-04-19 DIAGNOSIS — Z860100 Personal history of colon polyps, unspecified: Secondary | ICD-10-CM

## 2023-04-19 DIAGNOSIS — Z9049 Acquired absence of other specified parts of digestive tract: Secondary | ICD-10-CM | POA: Insufficient documentation

## 2023-04-19 LAB — CBC WITH DIFFERENTIAL/PLATELET
Basophils Absolute: 0 10*3/uL (ref 0.0–0.1)
Basophils Relative: 1.1 % (ref 0.0–3.0)
Eosinophils Absolute: 0.2 10*3/uL (ref 0.0–0.7)
Eosinophils Relative: 3.7 % (ref 0.0–5.0)
HCT: 29.5 % — ABNORMAL LOW (ref 39.0–52.0)
Hemoglobin: 10.1 g/dL — ABNORMAL LOW (ref 13.0–17.0)
Lymphocytes Relative: 9.8 % — ABNORMAL LOW (ref 12.0–46.0)
Lymphs Abs: 0.4 10*3/uL — ABNORMAL LOW (ref 0.7–4.0)
MCHC: 34.1 g/dL (ref 30.0–36.0)
MCV: 101.2 fL — ABNORMAL HIGH (ref 78.0–100.0)
Monocytes Absolute: 0.5 10*3/uL (ref 0.1–1.0)
Monocytes Relative: 11.1 % (ref 3.0–12.0)
Neutro Abs: 3.3 10*3/uL (ref 1.4–7.7)
Neutrophils Relative %: 74.3 % (ref 43.0–77.0)
Platelets: 168 10*3/uL (ref 150.0–400.0)
RBC: 2.92 Mil/uL — ABNORMAL LOW (ref 4.22–5.81)
RDW: 13.8 % (ref 11.5–15.5)
WBC: 4.5 10*3/uL (ref 4.0–10.5)

## 2023-04-19 LAB — COMPREHENSIVE METABOLIC PANEL
ALT: 15 U/L (ref 0–53)
AST: 22 U/L (ref 0–37)
Albumin: 3.7 g/dL (ref 3.5–5.2)
Alkaline Phosphatase: 55 U/L (ref 39–117)
BUN: 24 mg/dL — ABNORMAL HIGH (ref 6–23)
CO2: 28 meq/L (ref 19–32)
Calcium: 8.7 mg/dL (ref 8.4–10.5)
Chloride: 100 meq/L (ref 96–112)
Creatinine, Ser: 1.06 mg/dL (ref 0.40–1.50)
GFR: 68.67 mL/min (ref >60.00)
Glucose, Bld: 108 mg/dL — ABNORMAL HIGH (ref 70–99)
Potassium: 4.1 meq/L (ref 3.5–5.1)
Sodium: 138 meq/L (ref 135–145)
Total Bilirubin: 0.5 mg/dL (ref 0.2–1.2)
Total Protein: 6.4 g/dL (ref 6.0–8.3)

## 2023-04-19 LAB — MAGNESIUM: Magnesium: 1.8 mg/dL (ref 1.5–2.5)

## 2023-04-19 LAB — LIPID PANEL
Cholesterol: 169 mg/dL (ref 0–200)
HDL: 71.6 mg/dL (ref >39.00)
LDL Cholesterol: 84 mg/dL (ref 0–99)
NonHDL: 97.69
Total CHOL/HDL Ratio: 2
Triglycerides: 69 mg/dL (ref 0.0–149.0)
VLDL: 13.8 mg/dL (ref 0.0–40.0)

## 2023-04-19 LAB — PSA: PSA: 1.79 ng/mL (ref 0.10–4.00)

## 2023-04-19 NOTE — Assessment & Plan Note (Signed)
Esophagitis   A history of esophagitis was noted on endoscopy in May/June, currently asymptomatic. Monitor for symptoms and consider endoscopy if he recurs.

## 2023-04-19 NOTE — Assessment & Plan Note (Signed)
Hypogonadism   Managed with AndroGel, which was held during cancer treatment. Discuss resuming AndroGel with [facility] during the next visit.

## 2023-04-19 NOTE — Assessment & Plan Note (Signed)
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 The cure rate is high, approaching 100%. Symptoms and overall well-being have improved. Continue follow-up with [facility] for a PET scan in April and consider transitioning surveillance to a local ENT specialist.

## 2023-04-19 NOTE — Assessment & Plan Note (Signed)
We should discuss Cologuard vs colonoscopy in future.

## 2023-04-19 NOTE — Assessment & Plan Note (Signed)
Hypomagnesemia   Diagnosed with hypomagnesemia in November at [facility], with non-compliance to magnesium oxide supplementation. Reinforce the importance of daily supplementation and recheck magnesium levels if clinically indicated.

## 2023-04-19 NOTE — Assessment & Plan Note (Signed)
Unintentional Weight Loss   Weight has decreased from 178 lbs to 141 lbs due to dysgeusia and reduced appetite. High-calorie supplements and calorie-dense foods are important. Recommend daily intake of supplements like Boost and encourage consumption of foods such as avocados and peanuts. Experiment with sauces to improve food palatability.

## 2023-04-19 NOTE — Assessment & Plan Note (Signed)
Voiding Dysfunction   Urinary incontinence is managed with Flomax and VESIcare, with occasional nocturia reported. Continue current medications and follow up with urologist Dr. Marcelyn Bruins for ongoing management.

## 2023-04-19 NOTE — Assessment & Plan Note (Signed)
Hyperlipidemia   On atorvastatin 40 mg and CoQ10, with the last cholesterol check in April of last year. The cardiologist's goal is LDL < 55 due to a high coronary calcium score. Order a lipid panel and continue current medications.

## 2023-04-19 NOTE — Patient Instructions (Signed)
VISIT SUMMARY:  Dwayne Harper, a 78 year old male with a history of head and neck cancer, came in for a follow-up visit. He has been treated with chemotherapy and proton radiation therapy, and a recent PET scan showed no evidence of disease. He experiences persistent symptoms from radiation therapy, including dry mouth and taste issues, and has had significant weight loss. He also has a history of esophagitis, low magnesium levels, urinary incontinence, high cholesterol, a blood clot in the leg, and hypogonadism. His current medications include atorvastatin, CoQ10, Flomax, VESIcare, and Eliquis.  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 Continue follow-up with [facility] for a PET scan in April and consider transitioning to a local ENT specialist for ongoing surveillance.  -RADIATION-INDUCED MUCOSITIS AND ATROPHIC GLOSSITIS: These conditions are side effects of radiation therapy, causing dry mouth, phlegm, and taste issues. Continue using the magic mouthwash with lidocaine for relief, and consider adding hydrocortisone if symptoms persist. A referral to an ENT specialist is advised for ongoing management.  -UNINTENTIONAL WEIGHT LOSS: Your weight loss is due to taste issues and reduced appetite. It's important to consume high-calorie supplements like Boost and calorie-dense foods such as avocados and peanuts. Experiment with sauces to make food more palatable.  -ESOPHAGITIS: You have a history of esophagitis, which is currently asymptomatic. Monitor for any symptoms and consider an endoscopy if they recur.  -HYPOMAGNESEMIA: You have low magnesium levels and need to take magnesium oxide supplements daily. It's important to be consistent with this supplementation, and we will recheck your magnesium levels if needed.  -DEEP VEIN THROMBOSIS (DVT): You had a blood clot in your  left leg, likely related to your cancer. Continue taking Eliquis as prescribed and follow up with [facility] for further management.  -VOIDING DYSFUNCTION: Your urinary incontinence is managed with Flomax and VESIcare. Continue these medications and follow up with your urologist, Dr. Marcelyn Bruins, for ongoing management.  -HYPERLIPIDEMIA: You have high cholesterol and are taking atorvastatin and CoQ10. The goal is to keep your LDL cholesterol below 55. We will order a lipid panel to check your levels and continue your current medications.  -HYPOGONADISM: Your low testosterone levels were managed with AndroGel, which was paused during your cancer treatment. Discuss resuming AndroGel with [facility] during your next visit.  -GENERAL HEALTH MAINTENANCE: Your vaccinations and cancer screenings are up-to-date. We will order a general health panel, including a PSA test for prostate cancer screening, to ensure everything else is in good shape.  INSTRUCTIONS:  Follow up with [facility] in April for a PET scan. Schedule an appointment with a local ENT specialist for ongoing management. Continue seeing your urologist, Dr. Marcelyn Bruins. We will order blood work for a lipid panel, magnesium levels, and a general health panel.

## 2023-04-19 NOTE — Assessment & Plan Note (Signed)
Deep Vein Thrombosis (DVT)   2024 A single episode of DVT in the left leg, likely cancer-associated, is managed with Eliquis as prescribed by [facility]. Continue Eliquis and follow up with [facility] for further management.

## 2023-04-19 NOTE — Progress Notes (Signed)
Patient Care Team: Lula Olszewski, MD as PCP - General (Internal Medicine) Marykay Lex, MD as PCP - Cardiology (Cardiology) Jamison Neighbor, MD (Urology) Clinic, [place] Today's Healthcare Provider: Lula Olszewski, MD   Chief Complaint:  Dwayne Harper is a 78 y.o. male who presents today for a subsequent Medicare Annual Wellness Visit and to discuss management of his chronic medical problems.  Assessment/Plan:  New/Acute Problems: Ongoing mucositis from recent radiation treatment Transferring care here so that his problems will be in epic emr.  Chronic Problems Addressed Today: Unintentional weight loss bloodwork for complex medical management of multiple problems.    Preventative Healthcare Health Maintenance Due  Topic Date Due   Medicare Annual Wellness (AWV)  Never done   Pneumonia Vaccine 33+ Years old (1 of 2 - PCV) Never done   Colonoscopy  Never done   INFLUENZA VACCINE  Never done  10/2013 colonoscopy showed tubular adenoma Late in season for flu shot this year COVID-19 Vaccine (5 - 2024-25 season) 11/07/2022 01/29/2020, 01/01/2020, 04/06/2019, Additional history exists   Influenza Vaccine (#1) 11/07/2022 01/30/2020, 12/07/2018, 11/21/2017, Additional history exists   DTaP/Tdap/Td Vaccines (5 - Td or Tdap) 09/25/2025 09/26/2015, 11/21/2014, 12/30/2011, Additional history exists   Pneumococcal Vaccine 50+ Completed 01/08/2014, 01/04/2013   Zoster Vaccines Completed 02/08/2018, 01/06/2018, 11/21/2017, Additional history exists      During the course of the visit the patient was educated and counseled about appropriate screening and preventive services including:        Fall prevention he is losing weight but still has enough muscle that he is low risk. Nutrition focus on avocados Physical Activity encouraged protein boost to help maintain muscle(s) mass Weight Management focus on protein boost and avocadoes Cognition assessed he is highly intelligent  with no signs of cognitive impairment or memory loss    Subjective:  HPI:  History of Present Illness Dwayne Harper "Dwayne Harper" is a 78 year old male with a history of head and neck cancer who presents for a follow-up visit.  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 A PET scan prior to treatment showed no other metastasis, and a follow-up PET scan is scheduled for April.  He experiences persistent phlegm, mucus production, and dry mouth at night, attributed to radiation therapy. He uses a lidocaine-based mouthwash for oral discomfort and dry mouth, which aids sleep. 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  He has a history of esophagitis confirmed by endoscopy in May or June of the previous year, with no current discomfort related to esophagitis.  He reports unintentional weight loss from 178 pounds to 140-141 pounds, uses nutritional supplements to maintain weight, and has difficulty with taste affecting cooking and eating habits.  He has a history of low magnesium levels diagnosed in November and is prescribed magnesium oxide, which he does not take regularly.  He experiences urinary incontinence managed with Flomax and VESIcare, with increased frequency of urination at night, getting up three to five times.  He is on atorvastatin and CoQ10 for high coronary calcium score and heart disease, aiming to keep cholesterol levels below 55. He does not take fish oil due to pill size.  He has a history of a blood clot in the left leg in  2024, associated with his cancer diagnosis, and is currently on Eliquis for anticoagulation.  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  He has a history of syncope in 2017, with no episodes since then. His wife also experiences syncope.  Health Risk Assessment: Patient considers his overall health to be good. He has no difficulty performing the following: Preparing food and eating Bathing  Getting dressed Using the toilet Shopping Managing Finances Moving around from place to place  He has not had any falls within the past year, had syncope with concussion in 2017 and it has not recurred since.  Low fall risk.     04/19/2023   10:41 AM  Depression screen PHQ 2/9  Decreased Interest 0  Down, Depressed, Hopeless 0  PHQ - 2 Score 0    Lifestyle Factors: Diet: difficult to eat balanced has mucositis and taste affected by radiation therapy, struggles to maintain weight. Able to cook on own. Exercise: not limited by treatment(s).  We updated his Patient Care Team: Lula Olszewski, MD as PCP - General (Internal Medicine) Marykay Lex, MD as PCP - Cardiology (Cardiology) Jamison Neighbor, MD (Urology) Clinic, [facility]    ROS: Per HPI, otherwise a complete review of systems was negative.   PMH:  The following were reviewed and entered/updated in epic: Past Medical History:  Diagnosis Date   Abnormal stress test 11/06/2015   Acute deep vein thrombosis (DVT) of popliteal vein of left lower extremity (HCC) 07/08/2022   Acute gangrenous cholecystitis s/p lap cholecystectomy 08/23/2019 08/23/2019   Asthma    Barrett esophagus    BPH (benign prostatic hyperplasia)    Cholecystitis with cholelithiasis 08/22/2019   Concussion with no loss of consciousness    DVT (deep venous thrombosis) (HCC) 09/2015   LLE   ED (erectile dysfunction)    Empyema of gallbladder 08/23/2019   GERD (gastroesophageal reflux disease)    Hiatal hernia    endoscopy    History of gout    Hyperlipidemia 11/08/2015   Pneumonia 1950s   Scalp hematoma    Syncope 09/26/2015    Testicular cancer (HCC) 1997   orchiectomy   Thrombocytopenia (HCC) 09/26/2015   Lab Results      Component    Value    Date           PLT    180    07/23/2022           PLT    168    08/25/2019           PLT    126 (L)    08/24/2019           PLT    131 (L)    08/22/2019           PLT    158    08/19/2019     No results found for: "ESRSEDRATE", "POCTSEDRATE"  No results found for: "CRP"  No results found for: "FERRITIN"  Peripheral smear:   JAK2, CALR, MPL, BCR-ABL1:     Gene   Tubular adenoma    Patient Active Problem List   Diagnosis Date Noted   History of cholecystectomy 04/19/2023   Unintentional weight loss 04/19/2023   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 02/17/2023   Personal history of irradiation 02/17/2023   Hypomagnesemia 01/17/2023   Malignant neoplasm of base of  tongue (HCC) 10/28/2022   Cervical lymphadenopathy 10/14/2022   Coronary artery disease, non-occlusive 05/28/2022   Esophagitis 03/17/2022   Hypogonadism in male 02/08/2017   Hyperlipidemia with target LDL less than 70 11/08/2015   History of DVT (deep vein thrombosis) 11/05/2015   BPH (benign prostatic hyperplasia) 09/11/2012   ED (erectile dysfunction) of organic origin 09/11/2012   Voiding dysfunction 08/25/2011   History of testicular cancer 04/19/2007   Past Surgical History:  Procedure Laterality Date   CARDIAC CATHETERIZATION N/A 11/07/2015   Procedure: Left Heart Cath and Coronary Angiography;  Surgeon: Kathleene Hazel, MD;  Location: [facility] INVASIVE CV LAB;  Service: CV:  p-mCx ~60-65%, Ost OM1 ~65-70%. mCX 40%. Ost-mid LAD 30%, Ost D1 ~60%, Mild RCA plaque.  - Non-obstructive CAD (NOT likely related to syncope).   CATARACT EXTRACTION, BILATERAL     COLONOSCOPY  10/2013   tubular adenoma   Exercise Treadmill Stress Test  11/04/2015   Significant ST depression in stage II and early recovery worrisome for ischemia. => Referred for Cardiac cath => no obstructive disease noted.  Likely false positive    LAPAROSCOPIC CHOLECYSTECTOMY SINGLE SITE WITH INTRAOPERATIVE CHOLANGIOGRAM N/A 08/23/2019   Procedure: LAPAROSCOPIC CHOLECYSTECTOMY WITH TAP BLOCK BILATERAL.;  Surgeon: Karie Soda, MD;  Location: [facility] ORS;  Service: General;  Laterality: N/A;   TESTICLE REMOVAL Left 1997   cancer   TONSILLECTOMY     TRANSTHORACIC ECHOCARDIOGRAM  09/27/2015   Normal LV function with EF 60 to 65%.  No RWMA.  Normal valves.  Normal PAP.  Normal RV.   WISDOM TOOTH EXTRACTION      Family History  Problem Relation Age of Onset   CAD Mother 22   Heart attack Mother    Atrial fibrillation Mother    Hypertension Mother    Heart disease Mother    Dementia Mother    Coronary artery disease Father    Aneurysm Father    Hypertension Father    Prostate cancer Father    Deep vein thrombosis Father 40       IVC filter   Deep vein thrombosis Daughter        during pregnancy    Medications- reviewed and updated Current Outpatient Medications  Medication Sig Dispense Refill   apixaban (ELIQUIS) 5 MG TABS tablet Take 1 tablet (5 mg total) by mouth 2 (two) times daily. Start taking after completion of starter pack. 60 tablet 5   Cholecalciferol (VITAMIN D3 PO) Take by mouth daily.     Coenzyme Q10 (CO Q 10 PO) Take 1 tablet by mouth in the morning.     Cyanocobalamin (VITAMIN B-12 PO) Take 1 tablet by mouth daily.     Multiple Vitamin (MULTIVITAMIN) tablet Take 1 tablet by mouth at bedtime.      Multiple Vitamins-Minerals (ZINC PO) Take by mouth daily.     omeprazole (PRILOSEC) 40 MG capsule Take 40 mg by mouth 2 (two) times daily.     OVER THE COUNTER MEDICATION Take 1 tablet by mouth in the morning and at bedtime. Focus Factor     Propylene Glycol (SYSTANE BALANCE) 0.6 % SOLN Place 1 drop into both eyes daily as needed (dry eyes).     sodium fluoride (FLUORISHIELD) 1.1 % GEL dental gel Apply 1 Application to the mouth or throat daily. Apply thin ribbon within trays and insert over teeth.  Leave in place for 5  minutes.  Remove trays and spit out excess.  Do not eat, drink or rinse for  30 minutes.     solifenacin (VESICARE) 10 MG tablet Take 10 mg by mouth daily.      tadalafil (CIALIS) 20 MG tablet Take 20 mg by mouth daily as needed for erectile dysfunction.      tamsulosin (FLOMAX) 0.4 MG CAPS capsule Take 0.4 mg by mouth daily after breakfast.      atorvastatin (LIPITOR) 40 MG tablet Take 1 tablet (40 mg total) by mouth daily. 90 tablet 3   No current facility-administered medications for this visit.    Allergies-reviewed and updated Allergies  Allergen Reactions   Lactose Intolerance (Gi)     unknown   Penicillins Other (See Comments)    Unknown reaction in childhood Has patient had a PCN reaction causing immediate rash, facial/tongue/throat swelling, SOB or lightheadedness with hypotension: unknown Has patient had a PCN reaction causing severe rash involving mucus membranes or skin necrosis: unknown Has patient had a PCN reaction that required hospitalization: unknown Has patient had a PCN reaction occurring within the last 10 years: No If all of the above answers are "NO", then may proceed with Cephalosporin use.     Oxycodone Other (See Comments)    "out of it" feeling.  No issue with codiene    Social History   Socioeconomic History   Marital status: Married    Spouse name: Not on file   Number of children: Not on file   Years of education: Not on file   Highest education level: Not on file  Occupational History   Occupation: CFO    Comment: [employer]  Tobacco Use   Smoking status: Former    Current packs/day: 0.00    Types: Cigarettes    Start date: 12/07/1968    Quit date: 12/08/1971    Years since quitting: 51.3   Smokeless tobacco: Never   Tobacco comments:    "quit before 1975"  Vaping Use   Vaping status: Never Used  Substance and Sexual Activity   Alcohol use: Yes    Alcohol/week: 3.0 standard drinks of alcohol    Types: 2 Glasses of wine, 1 Cans of beer  per week    Comment: 2-3 drinks/week   Drug use: No   Sexual activity: Not Currently  Other Topics Concern   Not on file  Social History Narrative   Pt lives in [place] with spouse.  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  Wife is endowed chair at [employer] in [employer].   Social Drivers of Corporate investment banker Strain: Not on file  Food Insecurity: No Food Insecurity (10/29/2022)   [facility]   Hunger Vital Sign    Worried About Running Out of Food in the Last Year: Never true    Ran Out of Food in the Last Year: Never true  Transportation Needs: No Transportation Needs (10/29/2022)   Received from [facility] - Transportation    Lack of Transportation (Medical): No    Lack of Transportation (Non-Medical): No  Physical Activity: Sufficiently Active (10/28/2022)   Received from [facility]   Exercise Vital Sign    Days of Exercise per Week: 5 days    Minutes of Exercise per Session: 50 min  Stress: Not on file  Social Connections: Not on file       Objective/Observations  Physical Exam: BP 120/72   Pulse 62   Temp (!) 97.2 F (36.2 C) (Temporal)   Ht 5' 6.93" (1.7 m)  Wt 141 lb 12.8 oz (64.3 kg)   SpO2 99%   BMI 22.26 kg/m  Vision and hearing tested and intact by reading text and finger rubs. Full eye exam encouraged  Gen: NAD, resting comfortably HEENT: TMs normal bilaterally. OP clear. No thyromegaly noted.  CV: RRR with no murmurs appreciated Pulm: NWOB, CTAB with no crackles, wheezes, or rhonchi GI: Normal bowel sounds present. Soft, Nontender, Nondistended. MSK: no edema, cyanosis, or clubbing noted Spine non-tender Skin: warm, dry, no lesions of concern noted. Neuro: CN2-12 grossly intact. Strength 5/5 in upper and lower extremities. Reflexes symmetric and intact bilaterally. Normal extensive cognitive assessment - recalls his entire complex medical record with astounding detail. Psych: Normal affect and  thought content  Results for orders placed or performed in visit on 04/19/23 (from the past 24 hours)  CBC with Differential/Platelet     Status: Abnormal   Collection Time: 04/19/23 12:05 PM  Result Value Ref Range   WBC 4.5 4.0 - 10.5 K/uL   RBC 2.92 (L) 4.22 - 5.81 Mil/uL   Hemoglobin 10.1 (L) 13.0 - 17.0 g/dL   HCT 96.0 (L) 45.4 - 09.8 %   MCV 101.2 (H) 78.0 - 100.0 fl   MCHC 34.1 30.0 - 36.0 g/dL   RDW 11.9 14.7 - 82.9 %   Platelets 168.0 150.0 - 400.0 K/uL   Neutrophils Relative % 74.3 43.0 - 77.0 %   Lymphocytes Relative 9.8 (L) 12.0 - 46.0 %   Monocytes Relative 11.1 3.0 - 12.0 %   Eosinophils Relative 3.7 0.0 - 5.0 %   Basophils Relative 1.1 0.0 - 3.0 %   Neutro Abs 3.3 1.4 - 7.7 K/uL   Lymphs Abs 0.4 (L) 0.7 - 4.0 K/uL   Monocytes Absolute 0.5 0.1 - 1.0 K/uL   Eosinophils Absolute 0.2 0.0 - 0.7 K/uL   Basophils Absolute 0.0 0.0 - 0.1 K/uL  PSA     Status: None   Collection Time: 04/19/23 12:05 PM  Result Value Ref Range   PSA 1.79 0.10 - 4.00 ng/mL   LABS Magnesium: Low (01/2023)  RADIOLOGY PET scan: No evidence of metastasis  DIAGNOSTIC Endoscopy: Esophagitis present, no evidence of malignancy (08/2022)    Timed up and go under 6 secs  Assessment & Plan Encounter for subsequent annual wellness visit (AWV) in Medicare patient General Health Maintenance   Vaccinations and cancer screenings are up-to-date, but no recent blood work for general health maintenance. Order a general health panel including PSA for prostate cancer screening.  Follow-up   Follow up with [facility] in April for a PET scan, with a local ENT for ongoing management, and with urologist . Order blood work for a lipid panel, magnesium, and general health panel. Glossitis Radiation-Induced Mucositis and Atrophic Glossitis   Persistent mucositis and atrophic glossitis are secondary to radiation therapy, with symptoms of xerostomia, phlegm, and dysgeusia. No fungal infection is present. Use  magic mouthwash with lidocaine for relief and consider adding hydrocortisone if symptoms persist. Referral to an ENT for ongoing management and surveillance is advised. History of DVT (deep vein thrombosis) Deep Vein Thrombosis (DVT)   2024 A single episode of DVT in the left leg, likely cancer-associated, is managed with Eliquis as prescribed by [facility]. Continue Eliquis and follow up with [facility] for further management. Voiding dysfunction Voiding Dysfunction   Urinary incontinence is managed with Flomax and VESIcare, with occasional nocturia reported. Continue current medications and follow up with urologist Dr. Marcelyn Bruins for ongoing  management. Hypogonadism in male Hypogonadism   Managed with AndroGel, which was held during cancer treatment. Discuss resuming AndroGel with [facility] during the next visit. Hyperlipidemia with target LDL less than 70 Hyperlipidemia   On atorvastatin 40 mg and CoQ10, with the last cholesterol check in April of last year. The cardiologist's goal is LDL < 55 due to a high coronary calcium score. Order a lipid panel and continue current medications. Coronary artery disease, non-occlusive Hyperlipidemia   On atorvastatin 40 mg and CoQ10, with the last cholesterol check in April of last year. The cardiologist's goal is LDL < 55 due to a high coronary calcium score. Order a lipid panel and continue current medications. Unintentional weight loss Unintentional Weight Loss   Weight has decreased from 178 lbs to 141 lbs due to dysgeusia and reduced appetite. High-calorie supplements and calorie-dense foods are important. Recommend daily intake of supplements like Boost and encourage consumption of foods such as avocados and peanuts. Experiment with sauces to improve food palatability. Malignant neoplasm of base of tongue (HCC) Head and Neck Cancer (Base of Tongue)   The malignant tumor at the base of the tongue with metastasis to neck lymph nodes shows no  evidence of disease on a recent PET scan post-chemotherapy and proton radiation therapy at [facility]. The cure rate is high, approaching 100%. Symptoms and overall well-being have improved. Continue follow-up with [facility] for a PET scan in April and consider transitioning surveillance to a local ENT specialist. Esophagitis Esophagitis   A history of esophagitis was noted on endoscopy in May/June, currently asymptomatic. Monitor for symptoms and consider endoscopy if he recurs. Hypomagnesemia Hypomagnesemia   Diagnosed with hypomagnesemia in November at [facility], with non-compliance to magnesium oxide supplementation. Reinforce the importance of daily supplementation and recheck magnesium levels if clinically indicated. History of colon polyps We should discuss Cologuard vs colonoscopy in future.  Attestation:  I have personally spent  108 minutes involved in face-to-face and non-face-to-face activities for this patient on the day of the visit. Professional time spent includes the following activities:  Preparing to see the patient by reviewing medical records prior to and during the encounter; Obtaining, documenting, and reviewing an updated medical history; Performing a medically appropriate examination;  Evaluating, synthesizing, and documenting the available clinical information in the EMR;  Coordinating/Communicating with other health care professionals; Independently interpreting results (not separately reported), Communicating, counseling, educating about results to the patient/family/caregiver (not separately reported); Collaboratively developing and communicating an individualized treatment plan with the patient; Placing medically necessary orders (for medications/tests/procedures/referrals);   This time was independent of any separately billable procedure(s).  The extended duration of this patient visit was medically necessary due to several factors:  The patient's health condition is  multifaceted, requiring a comprehensive evaluation of patient and their past records to ensure accurate diagnosis and treatment planning; Effective patient education and communication, particularly for patients with complex care needs, often require additional time to ensure the patient (or caregivers) fully understand the care plan;  Coordination of care with other healthcare professionals and services depends on thorough documentation, extending both documentation time and visit durations.  All these factors are integral to providing high-quality patient care and ensuring optimal health outcomes.  Most of the time spent today was on massive review of extensive records and updated problem list so that his records would be accurate going forward.   Lula Olszewski, MD 04/19/2023 3:57 PM

## 2023-04-20 LAB — T4F: T4,Free (Direct): 0.86 ng/dL (ref 0.82–1.77)

## 2023-04-20 LAB — TSH RFX ON ABNORMAL TO FREE T4: TSH: 12.3 u[IU]/mL — ABNORMAL HIGH (ref 0.450–4.500)

## 2023-04-22 ENCOUNTER — Encounter: Payer: Self-pay | Admitting: Internal Medicine

## 2023-04-22 DIAGNOSIS — E039 Hypothyroidism, unspecified: Secondary | ICD-10-CM | POA: Insufficient documentation

## 2023-04-22 NOTE — Telephone Encounter (Signed)
Patient's review of lab results/notes confirmed.

## 2023-04-22 NOTE — Progress Notes (Signed)
Reviewed labs from 04/19/2023. TSH elevated at 12.300 with T4 still in normal range, consistent with subclinical hypothyroidism. Will monitor. Mild macrocytic anemia noted. Recent weight loss from 178 to 141 lbs partially explained by cancer treatment effects and newly identified thyroid dysfunction. Lipids at goal. Magnesium improved with supplementation. Cancer treatment showing excellent response on recent PET scan. Detailed explanation and follow-up plan provided in Patient Message.

## 2023-07-10 NOTE — Progress Notes (Signed)
 Cardiology Office Note:  .   Date:  07/18/2023  ID:  Dwayne Harper, DOB 1945-12-30, MRN [id number] PCP: Anthon Kins, MD  [facility] Providers Cardiologist:  Randene Bustard, MD  Gastroenterologist: Tami Falcon, MD  Chief Complaint  Patient presents with   Follow-up    Most recent issue related to head and neck cancer where he went to [facility] for treatment.  Recovering well.   Coronary Artery Disease    No angina    Patient Profile: .     Dwayne Harper is a  78 y.o. 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  He was initially referred at the request of Benedetta Bradley, *for evaluation of elevated Coronary Calcium  Score (CAC of 1676) with prior documented moderate/nonobstructive CAD.  His wife-Diane Welsh-is also a patient of mine.    Margaret Steenstra was last seen on 07/02/2022: Very active, exercising vigorously with no symptoms-able to push himself the muscular failure with no symptoms..  Recommendation was continued aggressive medical therapy.  Atorvastatin  increased to 40 mg daily.  Targeting LDL goal<55.  Also suggested that he discuss with his gastroenterologist to see if she would approve starting 81 mg aspirin .  Subjective  Discussed the use of AI scribe software for clinical note transcription with the patient, who gave verbal consent to proceed.  History of Present Illness History of Present Illness Dwayne Harper "Debria Fang" is a 78 year old male presents for follow-up with a recent diagnosis and treatment of HPV-related oral cancer.  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 He underwent a clinical research study followed by seven weeks of proton radiation therapy and weekly cisplatin chemotherapy, which concluded on February 11, 2023. The treatment was successful in eliminating the  tumor. He experienced multiple misdiagnoses locally before a PA identified the issue as cancer, initially thought to be a bad earache. A biopsy confirmed the diagnosis, leading to treatment at [facility] in [place], [place] .  He is currently on Eliquis  following a DVT diagnosis in April or May of 2024, which was identified after his leg swelled up. No further swelling, shortness of breath, chest pain, or heart palpitations have occurred since then.  His current medications include atorvastatin , CoQ10, Prilosec (omeprazole) twice daily, and Flomax  3.4 mg daily. He has Cialis but is not actively using it.  He reports a significant weight loss of approximately 38 pounds since his cancer diagnosis around August 2024. He completed his cancer treatment in December 2024 and has since resumed physical activities, including going to the gym and doing household tasks.  He is not yet back to his full levels of exercise, but is building up his strength.  Has remained asymptomatic from a cardiac standpoint.  No recent swelling, shortness of breath, chest pain, heart palpitations, stroke-like symptoms, or gastrointestinal bleeding. He notes a gradual loss of strength but no sudden weakness.     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 Prox-mid LCx 65% & mid LCx 40% with ost OM1 70%. Prox-mid RCA 20%. => Med Rx Dominance: Right   TTE 7/222017: (For syncope) normal EF of 60 to 65%.  Grossly normal diastolic function.  Normal wall motion with no RWMA.  Normal valves with dissection of trivial TR.  Risk Assessment/Calculations:        Physical Exam:   VS:  BP 108/70   Pulse (!) 54   Ht 5\' 5"  (1.651 m)   Wt 147 lb (66.7 kg)   SpO2 99%   BMI 24.46 kg/m    Wt Readings from Last 3 Encounters:  07/11/23 147 lb (66.7 kg)  04/19/23 141 lb 12.8 oz (64.3 kg)  07/23/22 154 lb 8 oz (70.1 kg)    GEN: Healthy appearing; Well nourished, well groomed in no acute distress;  NECK: No JVD; No carotid bruits CARDIAC: Normal S1, S2; RRR, no murmurs, rubs, gallops RESPIRATORY:  Clear to auscultation without rales, wheezing or rhonchi ; nonlabored, good air movement. ABDOMEN: Soft, non-tender, non-distended EXTREMITIES:  No edema; No deformity      ASSESSMENT AND PLAN: .    Problem List Items Addressed This Visit       Cardiology Problems   Coronary artery disease, non-occlusive - Primary (Chronic)   Mild diffuse disease with focal moderate disease on cardiac catheterization several years ago associate with elevated Coronary Calcium  Score that is quite high.  He remains very active and has not had any cardiac symptoms. - Not on aspirin  because of Eliquis  (once he is off Eliquis , can go back to aspirin  81 mg which could be held 5 to 7 days preop for surgeries or procedures.) - Continue atorvastatin  40 midodrine daily and co-Q10 targeting LDL  less than 70 - With normal blood pressure and no symptoms, we are holding off on any beta-blocker or ARB/calcium  channel blocker.      Relevant Medications   rosuvastatin  (CRESTOR ) 40 MG tablet   Other Relevant Orders   EKG 12-Lead (Completed)   Hepatic function panel   Lipid panel   Hyperlipidemia with target LDL less than 70 (Chronic)   Most recent LDL was 84 in February.  Not quite where we would like to be. Goal is further LDL reduction due to coronary calcium  score and plaque burden. - Switch atorvastatin  to rosuvastatin  40 mg once current supply is finished. - Order lipid panel and liver function tests 3-4 months after switching to rosuvastatin .      Relevant Medications   rosuvastatin  (CRESTOR ) 40 MG tablet   Other Relevant Orders   EKG 12-Lead (Completed)   Hepatic function panel   Lipid panel     Other   H/O oral cancer   Completed proton radiation and cisplatin chemotherapy.  Currently cancer-free with regular follow-up. - Continue follow-up every four months for cancer surveillance.      History of DVT (deep vein thrombosis) (Chronic)   DVT in the leg, on Eliquis  for anticoagulation. No symptoms of recurrence. Considering Eliquis  continuation due to increased thrombotic risk from recent cancer treatment. - Consult oncologist regarding continuation of Eliquis .             Follow-Up: Return in about 1 year (around 07/10/2024) for [employer].     Signed, Arleen Lacer, MD, MS Randene Bustard, M.D., M.S. Interventional Chartered certified accountant  Pager # [phone]

## 2023-07-11 ENCOUNTER — Other Ambulatory Visit: Payer: Self-pay | Admitting: *Deleted

## 2023-07-11 ENCOUNTER — Ambulatory Visit: Payer: BLUE CROSS/BLUE SHIELD | Attending: Cardiology | Admitting: Cardiology

## 2023-07-11 VITALS — BP 108/70 | HR 54 | Ht 65.0 in | Wt 147.0 lb

## 2023-07-11 DIAGNOSIS — Z86718 Personal history of other venous thrombosis and embolism: Secondary | ICD-10-CM | POA: Diagnosis not present

## 2023-07-11 DIAGNOSIS — E785 Hyperlipidemia, unspecified: Secondary | ICD-10-CM | POA: Diagnosis not present

## 2023-07-11 DIAGNOSIS — I2510 Atherosclerotic heart disease of native coronary artery without angina pectoris: Secondary | ICD-10-CM

## 2023-07-11 DIAGNOSIS — Z85819 Personal history of malignant neoplasm of unspecified site of lip, oral cavity, and pharynx: Secondary | ICD-10-CM | POA: Diagnosis not present

## 2023-07-11 MED ORDER — ROSUVASTATIN CALCIUM 40 MG PO TABS
40.0000 mg | ORAL_TABLET | Freq: Every day | ORAL | 3 refills | Status: AC
Start: 2023-07-11 — End: 2023-12-08

## 2023-07-11 NOTE — Patient Instructions (Signed)
 Medication Instructions:   Once completed stop Atorvastatin  - stop   And then start Rosuvastatin 40 mg  daily   *If you need a refill on your cardiac medications before your next appointment, please call your pharmacy*   Lab Work: 3 - 4 months  after starting Rosuvastatin 40 mg  Lipid  liver Function     Testing/Procedures: Not needed   Follow-Up: At [facility], you and your health needs are our priority.  As part of our continuing mission to provide you with exceptional heart care, we have created designated Provider Care Teams.  These Care Teams include your primary Cardiologist (physician) and Advanced Practice Providers (APPs -  Physician Assistants and Nurse Practitioners) who all work together to provide you with the care you need, when you need it.     Your next appointment:   12 month(s)  The format for your next appointment:   In Person  Provider:   Randene Bustard, MD

## 2023-07-18 ENCOUNTER — Encounter: Payer: Self-pay | Admitting: Cardiology

## 2023-07-18 DIAGNOSIS — Z85819 Personal history of malignant neoplasm of unspecified site of lip, oral cavity, and pharynx: Secondary | ICD-10-CM | POA: Insufficient documentation

## 2023-07-18 NOTE — Assessment & Plan Note (Addendum)
 Mild diffuse disease with focal moderate disease on cardiac catheterization several years ago associate with elevated Coronary Calcium  Score that is quite high.  He remains very active and has not had any cardiac symptoms. - Not on aspirin  because of Eliquis  (once he is off Eliquis , can go back to aspirin  81 mg which could be held 5 to 7 days preop for surgeries or procedures.) - Continue atorvastatin  40 midodrine daily and co-Q10 targeting LDL less than 70 - With normal blood pressure and no symptoms, we are holding off on any beta-blocker or ARB/calcium  channel blocker.

## 2023-07-18 NOTE — Assessment & Plan Note (Signed)
 DVT in the leg, on Eliquis  for anticoagulation. No symptoms of recurrence. Considering Eliquis  continuation due to increased thrombotic risk from recent cancer treatment. - Consult oncologist regarding continuation of Eliquis .

## 2023-07-18 NOTE — Assessment & Plan Note (Signed)
 Most recent LDL was 84 in February.  Not quite where we would like to be. Goal is further LDL reduction due to coronary calcium  score and plaque burden. - Switch atorvastatin  to rosuvastatin  40 mg once current supply is finished. - Order lipid panel and liver function tests 3-4 months after switching to rosuvastatin .

## 2023-07-18 NOTE — Assessment & Plan Note (Signed)
 Completed proton radiation and cisplatin chemotherapy. Currently cancer-free with regular follow-up. - Continue follow-up every four months for cancer surveillance.

## 2023-08-11 ENCOUNTER — Telehealth: Payer: Self-pay

## 2023-08-11 NOTE — Telephone Encounter (Signed)
 Copied from CRM [id number]. Topic: Referral - Request for Referral >> Aug 11, 2023  2:15 PM Rosaria Common wrote: Did the patient discuss referral with their provider in the last year? No (If No - schedule appointment) (If Yes - send message)  Appointment offered? Yes  Type of order/referral and detailed reason for visit: GI for endoscopy  Preference of office, provider, location: [facility]  If referral order, have you been seen by this specialty before? Yes (If Yes, this issue or another issue? When? Where?  Can we respond through MyChart? Yes

## 2023-08-11 NOTE — Telephone Encounter (Signed)
 Review please   Copied from CRM [id number]. Topic: Referral - Request for Referral >> Aug 11, 2023  2:15 PM Rosaria Common wrote: Did the patient discuss referral with their provider in the last year? No (If No - schedule appointment) (If Yes - send message)  Appointment offered? Yes  Type of order/referral and detailed reason for visit: GI for endoscopy  Preference of office, provider, location: [facility]  If referral order, have you been seen by this specialty before? Yes (If Yes, this issue or another issue? When? Where?  Can we respond through MyChart? Yes

## 2023-08-12 NOTE — Telephone Encounter (Signed)
 read by Randal Bury "Ted" at 11:04AM on 08/12/2023

## 2023-08-22 ENCOUNTER — Encounter: Payer: Self-pay | Admitting: Internal Medicine

## 2023-08-22 ENCOUNTER — Ambulatory Visit: Admitting: Internal Medicine

## 2023-08-22 VITALS — BP 100/60 | HR 72 | Temp 98.0°F | Ht 65.0 in | Wt 142.6 lb

## 2023-08-22 DIAGNOSIS — C01 Malignant neoplasm of base of tongue: Secondary | ICD-10-CM | POA: Diagnosis not present

## 2023-08-22 DIAGNOSIS — S46001D Unspecified injury of muscle(s) and tendon(s) of the rotator cuff of right shoulder, subsequent encounter: Secondary | ICD-10-CM

## 2023-08-22 DIAGNOSIS — K209 Esophagitis, unspecified without bleeding: Secondary | ICD-10-CM

## 2023-08-22 DIAGNOSIS — Z85819 Personal history of malignant neoplasm of unspecified site of lip, oral cavity, and pharynx: Secondary | ICD-10-CM

## 2023-08-22 DIAGNOSIS — R7989 Other specified abnormal findings of blood chemistry: Secondary | ICD-10-CM | POA: Diagnosis not present

## 2023-08-22 DIAGNOSIS — Z86718 Personal history of other venous thrombosis and embolism: Secondary | ICD-10-CM

## 2023-08-22 DIAGNOSIS — S46001A Unspecified injury of muscle(s) and tendon(s) of the rotator cuff of right shoulder, initial encounter: Secondary | ICD-10-CM | POA: Insufficient documentation

## 2023-08-22 LAB — COMPREHENSIVE METABOLIC PANEL WITH GFR
ALT: 15 U/L (ref 0–53)
AST: 21 U/L (ref 0–37)
Albumin: 3.6 g/dL (ref 3.5–5.2)
Alkaline Phosphatase: 43 U/L (ref 39–117)
BUN: 28 mg/dL — ABNORMAL HIGH (ref 6–23)
CO2: 30 meq/L (ref 19–32)
Calcium: 8.5 mg/dL (ref 8.4–10.5)
Chloride: 104 meq/L (ref 96–112)
Creatinine, Ser: 1.28 mg/dL (ref 0.40–1.50)
GFR: 54.63 mL/min — ABNORMAL LOW (ref >60.00)
Glucose, Bld: 89 mg/dL (ref 70–99)
Potassium: 4.5 meq/L (ref 3.5–5.1)
Sodium: 139 meq/L (ref 135–145)
Total Bilirubin: 0.4 mg/dL (ref 0.2–1.2)
Total Protein: 6.3 g/dL (ref 6.0–8.3)

## 2023-08-22 LAB — CBC WITH DIFFERENTIAL/PLATELET
Basophils Absolute: 0 10*3/uL (ref 0.0–0.1)
Basophils Relative: 1.1 % (ref 0.0–3.0)
Eosinophils Absolute: 0.1 10*3/uL (ref 0.0–0.7)
Eosinophils Relative: 3.9 % (ref 0.0–5.0)
HCT: 30.3 % — ABNORMAL LOW (ref 39.0–52.0)
Hemoglobin: 10.3 g/dL — ABNORMAL LOW (ref 13.0–17.0)
Lymphocytes Relative: 14.7 % (ref 12.0–46.0)
Lymphs Abs: 0.4 10*3/uL — ABNORMAL LOW (ref 0.7–4.0)
MCHC: 34 g/dL (ref 30.0–36.0)
MCV: 95.6 fl (ref 78.0–100.0)
Monocytes Absolute: 0.5 10*3/uL (ref 0.1–1.0)
Monocytes Relative: 16.2 % — ABNORMAL HIGH (ref 3.0–12.0)
Neutro Abs: 1.9 10*3/uL (ref 1.4–7.7)
Neutrophils Relative %: 64.1 % (ref 43.0–77.0)
Platelets: 172 10*3/uL (ref 150.0–400.0)
RBC: 3.17 Mil/uL — ABNORMAL LOW (ref 4.22–5.81)
RDW: 13.6 % (ref 11.5–15.5)
WBC: 3 10*3/uL — ABNORMAL LOW (ref 4.0–10.5)

## 2023-08-22 LAB — MAGNESIUM: Magnesium: 1.9 mg/dL (ref 1.5–2.5)

## 2023-08-22 NOTE — Assessment & Plan Note (Signed)
 He has esophagitis with potential Barrett's esophagus. A previous endoscopy suggested probable dysplasia and pre-Barrett's esophagus. He requests a repeat endoscopy to assess for Barrett's esophagus progression and prefers referral within the Epic system for better data integration. Refer to a GI specialist at [facility] for EGD to rule out Barrett's esophagus.

## 2023-08-22 NOTE — Assessment & Plan Note (Signed)
 Include to gastrointestinal ref

## 2023-08-22 NOTE — Patient Instructions (Addendum)
 It was a pleasure seeing you today! Your health and satisfaction are our top priorities.  Scherrie Curt, MD  Your Providers PCP: Anthon Kins, MD,  [phone]) Referring Provider: Anthon Kins, MD,  [phone]) Care Team Provider: Arleen Lacer, MD,  [phone]) Care Team Provider: Senaida Dama, MD,  [phone]) Care Team Provider: Gang Mills, [employer],  [phone])   VISIT SUMMARY:  Today, we discussed your use of Eliquis , the need for an endoscopy, and reviewed your current medications and health conditions. You have a history of blood clots, and we talked about stopping Eliquis  with close monitoring. We also addressed your concerns about possible Barrett's esophagus and arranged for a referral for an endoscopy. Additionally, we reviewed your thyroid medication, rotator cuff injury, and general health maintenance.  YOUR PLAN:  -DEEP VEIN THROMBOSIS (DVT): DVT is a condition where blood clots form in deep veins, often in the legs. You have a history of DVT associated with cancer, and although you are now cancer-free, we discussed stopping Eliquis  with close monitoring for any signs of blood clots. If you notice any symptoms like swelling, pain, or redness in your legs, please seek medical attention immediately.  -GASTROESOPHAGEAL REFLUX DISEASE (GERD) WITH POSSIBLE BARRETT'S ESOPHAGUS: GERD is a condition where stomach acid frequently flows back into the tube connecting your mouth and stomach, which can lead to Barrett's esophagus, a condition where the lining of the esophagus changes. We will refer you to a GI specialist at [facility] for an endoscopy to check for any progression of Barrett's esophagus.  -HYPOTHYROIDISM: Hypothyroidism is a condition where your thyroid gland doesn't produce enough thyroid hormone. You are currently taking levothyroxine, and your symptoms have improved. We will recheck your thyroid function with TSH and free T4 blood tests to ensure your levels  are stable. Continue taking levothyroxine 75 mcg daily.  -ROTATOR CUFF INJURY: A rotator cuff injury involves a tear in the muscles and tendons that stabilize your shoulder. You are undergoing physical therapy and showing improvement. Continue with your physical therapy as recommended.  -GENERAL HEALTH MAINTENANCE: We will recheck your B12 and magnesium levels and confirm your pneumonia vaccination status. These routine checks help ensure your overall health is maintained.  INSTRUCTIONS:  Please schedule an appointment with a GI specialist at [facility] for your endoscopy. Continue monitoring for any signs of blood clots and seek immediate medical attention if you notice symptoms. Follow up with blood tests for your thyroid function, B12, and magnesium levels. Continue with your physical therapy for your rotator cuff injury.  NEXT STEPS: [x]  Early Intervention: Schedule sooner appointment, call our on-call services, or go to emergency room if there is any significant Increase in pain or discomfort New or worsening symptoms Sudden or severe changes in your health [x]  Flexible Follow-Up: We recommend a Return in about 3 months (around 11/22/2023) for chronic disease monitoring and management. for optimal routine care. This allows for progress monitoring and treatment adjustments. [x]  Preventive Care: Schedule your annual preventive care visit! It's typically covered by insurance and helps identify potential health issues early. [x]  Lab & X-ray Appointments: Incomplete tests scheduled today, or call to schedule. X-rays: [facility] (M-F, 8:30am-noon or 1pm-5pm). [x]  Medical Information Release: Sign a release form at front desk to obtain relevant medical information we don't have.  MAKING THE MOST OF OUR FOCUSED 20 MINUTE APPOINTMENTS: [x]   Clearly state your top concerns at the beginning of the visit to focus our discussion [x]   If you anticipate  you will need more time, please  inform the front desk during scheduling - we can book multiple appointments in the same week. [x]   If you have transportation problems- use our convenient video appointments or ask about transportation support. [x]   We can get down to business faster if you use MyChart to update information before the visit and submit non-urgent questions before your visit. Thank you for taking the time to provide details through MyChart.  Let our nurse know and she can import this information into your encounter documents.  Arrival and Wait Times: [x]   Arriving on time ensures that everyone receives prompt attention. [x]   Early morning (8a) and afternoon (1p) appointments tend to have shortest wait times. [x]   Unfortunately, we cannot delay appointments for late arrivals or hold slots during phone calls.  Getting Answers and Following Up [x]   Simple Questions & Concerns: For quick questions or basic follow-up after your visit, reach us  at [phone] or MyChart messaging. [x]   Complex Concerns: If your concern is more complex, scheduling an appointment might be best. Discuss this with the staff to find the most suitable option. [x]   Lab & Imaging Results: We'll contact you directly if results are abnormal or you don't use MyChart. Most normal results will be on MyChart within 2-3 business days, with a review message from Dr. Boston Byers. Haven't heard back in 2 weeks? Need results sooner? Contact us  at [phone]. [x]   Referrals: Our referral coordinator will manage specialist referrals. The specialist's office should contact you within 2 weeks to schedule an appointment. Call us  if you haven't heard from them after 2 weeks.  Staying Connected [x]   MyChart: Activate your MyChart for the fastest way to access results and message us . See the last page of this paperwork for instructions on how to activate.  Bring to Your Next Appointment [x]   Medications: Please bring all your medication bottles to your next  appointment to ensure we have an accurate record of your prescriptions. [x]   Health Diaries: If you're monitoring any health conditions at home, keeping a diary of your readings can be very helpful for discussions at your next appointment.  Billing [x]   X-ray & Lab Orders: These are billed by separate companies. Contact the invoicing company directly for questions or concerns. [x]   Visit Charges: Discuss any billing inquiries with our administrative services team.  Your Satisfaction Matters [x]   Share Your Experience: We strive for your satisfaction! If you have any complaints, or preferably compliments, please let Dr. Boston Byers know directly or contact our Practice Administrators, Olinda Bertrand or [employer], by asking at the front desk.   Reviewing Your Records [x]   Review this early draft of your clinical encounter notes below and the final encounter summary tomorrow on MyChart after its been completed.  All orders placed so far are visible here: Esophagitis -     Ambulatory referral to Gastroenterology -     CBC with Differential/Platelet -     Comprehensive metabolic panel with GFR  H/O oral cancer -     Ambulatory referral to Gastroenterology  Malignant neoplasm of base of tongue (HCC) -     Ambulatory referral to Gastroenterology -     CBC with Differential/Platelet -     Comprehensive metabolic panel with GFR  Injury of right rotator cuff, subsequent encounter Assessment & Plan: Updated problem overview for this problem to improve longitudinal management    TSH elevation -     TSH + free T4 -  CBC with Differential/Platelet -     Comprehensive metabolic panel with GFR -     Thyroid Panel With TSH  Hypomagnesemia -     Magnesium  History of DVT (deep vein thrombosis)

## 2023-08-22 NOTE — Progress Notes (Signed)
 ==============================  [facility]: [phone]   -- Medical Office Visit --  Patient: Dwayne Harper      Age: 78 y.o.       Sex:  male  Date:   08/22/2023 Today's Healthcare Provider: Anthon Kins, MD  ==============================   Chief Complaint: Referral Esophagus  Discussed the use of AI scribe software for clinical note transcription with the patient, who gave verbal consent to proceed.  History of Present Illness 78 year old male who presents for evaluation of Eliquis  use and referral for an endoscopy.  He was on Eliquis  until two months ago following a blood clot in his left calf in March-April 2024, which resolved. He had a previous DVT in 2017 due to prolonged hospitalization and another in 2024 associated with cancer. A recent PET scan at [facility] shows he is cancer-free.  He seeks a referral for an endoscopy due to a possible Barrett's esophagus diagnosis. He has had prior endoscopies showing esophagitis and wants to ensure the condition has not progressed.  He mentions a rotator cuff injury from a fall in May, confirmed by MRI, and is undergoing physical therapy. He reports improvement with physical therapy.  He is on levothyroxine for thyroid issues, which he started after cancer treatment. He reports feeling better with less cold intolerance. He takes 75 mcg of levothyroxine daily.  He takes several medications including omeprazole, atorvastatin , and supplements like CoQ10 and zinc. He recently completed a course of prednisone and is not currently on AndroGel as advised by [facility].  No excessive energy, trembling, palpitations, weight loss, diarrhea, changes in skin, hair, nails, heat intolerance, depression, or swelling of the neck. He felt cold prior to starting levothyroxine, but now feels better. No excessive energy or constipation. Interim history: denies fatigue, weight changes, heat/cold  intolerance, bowel/skin changes or CVS symptoms, feeling cold and cold intolerance, sweating    Latest Ref Rng & Units 04/19/2023   12:05 PM 04/20/2007    8:07 AM  THYROID  TSH 0.450 - 4.500 uIU/mL 12.300  1.62   T4,Free (Direct) 0.82 - 1.77 ng/dL 3.08     anti-TPO antibodies: N/A  anti-Tg antibodies: N/A  Anti-TSI antibodies: N/A   Family history of thyroid disease: Patient last evaluated for thyroid nodules: Last ultrasound for thyroid nodules: N/A  Last heart exam for rhythm check:   We haven't done any of this but he has started on levothyroxine 75 mcg daily and is ready to retest thyroid levels today- presumably the problem is related with cisplatin chemotherapy.   Background Reviewed: Problem List: has History of testicular cancer; History of DVT (deep vein thrombosis); Hyperlipidemia with target LDL less than 70; BPH (benign prostatic hyperplasia); ED (erectile dysfunction) of organic origin; Hypogonadism in male; Voiding dysfunction; Coronary artery disease, non-occlusive; Cervical lymphadenopathy; Esophagitis; Hypomagnesemia; Malignant neoplasm of base of tongue (HCC); 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; Personal history of irradiation; History of cholecystectomy; Unintentional weight loss; History of colon polyps; Hypothyroidism; H/O oral cancer; and Injury of right rotator cuff on their problem list. Past Medical History:  has a past medical history of Abnormal stress test (11/06/2015), Acute deep vein thrombosis (DVT) of popliteal vein of left lower extremity (HCC) (07/08/2022), Acute gangrenous cholecystitis s/p lap cholecystectomy 08/23/2019 (08/23/2019), Asthma, Barrett esophagus, BPH (benign prostatic hyperplasia), Cancer (HCC) (testicular cancer 1997), Cholecystitis with cholelithiasis (08/22/2019), Concussion with no loss of consciousness, DVT (deep venous thrombosis) (HCC) (09/2015), ED (erectile dysfunction), Empyema of gallbladder (08/23/2019),  GERD (gastroesophageal reflux  disease), Hiatal hernia, History of gout, Hyperlipidemia (11/08/2015), Pneumonia (1950s), Scalp hematoma, Syncope (09/26/2015), Testicular cancer (HCC) (1997), Thrombocytopenia (HCC) (09/26/2015), and Tubular adenoma. Past Surgical History:   has a past surgical history that includes Testicle removal (Left, 1997); Tonsillectomy; Colonoscopy (10/2013); Wisdom tooth extraction; Cardiac catheterization (N/A, 11/07/2015); Cataract extraction, bilateral; Laparoscopic cholecystectomy single site with intraoperative cholangiogram (N/A, 08/23/2019); transthoracic echocardiogram (09/27/2015); Exercise Treadmill Stress Test (11/04/2015); and Cholecystectomy [facility]). Social History:   reports that he quit smoking about 51 years ago. His smoking use included cigarettes. He started smoking about 54 years ago. He has never used smokeless tobacco. He reports current alcohol use of about 3.0 standard drinks of alcohol per week. He reports that he does not use drugs. Family History:  family history includes Aneurysm in his father; Atrial fibrillation in his mother; CAD (age of onset: 58) in his mother; COPD in his father; Coronary artery disease in his father; Deep vein thrombosis in his daughter; Deep vein thrombosis (age of onset: 65) in his father; Dementia in his mother; Heart attack in his mother; Heart disease in his mother; Hypertension in his father and mother; Prostate cancer in his father. Allergies:  is allergic to lactose intolerance (gi), penicillins, and oxycodone .   Medication Reconciliation: Current Outpatient Medications on File Prior to Visit  Medication Sig   atorvastatin  (LIPITOR) 40 MG tablet Take 1 tablet (40 mg total) by mouth daily.   Cholecalciferol (VITAMIN D3 PO) Take by mouth daily.   Coenzyme Q10 (CO Q 10 PO) Take 1 tablet by mouth in the morning.   Cyanocobalamin (VITAMIN B-12 PO) Take 1 tablet by mouth daily.   levothyroxine (SYNTHROID) 75 MCG tablet Take 75 mcg by mouth daily.    Multiple Vitamin (MULTIVITAMIN) tablet Take 1 tablet by mouth at bedtime.    Multiple Vitamins-Minerals (ZINC PO) Take by mouth daily.   omeprazole (PRILOSEC) 40 MG capsule Take 40 mg by mouth 2 (two) times daily.   OVER THE COUNTER MEDICATION Take 1 tablet by mouth in the morning and at bedtime. Focus Factor   Propylene Glycol (SYSTANE BALANCE) 0.6 % SOLN Place 1 drop into both eyes daily as needed (dry eyes).   rosuvastatin  (CRESTOR ) 40 MG tablet Take 1 tablet (40 mg total) by mouth daily.   sodium fluoride (FLUORISHIELD) 1.1 % GEL dental gel Apply 1 Application to the mouth or throat daily. Apply thin ribbon within trays and insert over teeth.  Leave in place for 5 minutes.  Remove trays and spit out excess.  Do not eat, drink or rinse for 30 minutes.   solifenacin (VESICARE) 10 MG tablet Take 10 mg by mouth daily.    tadalafil (CIALIS) 20 MG tablet Take 20 mg by mouth daily as needed for erectile dysfunction.    tamsulosin  (FLOMAX ) 0.4 MG CAPS capsule Take 0.4 mg by mouth daily after breakfast.    Misc Natural Products (FOCUSED MIND PO) FOCUS FACTOR   No current facility-administered medications on file prior to visit.   Medications Discontinued During This Encounter  Medication Reason   apixaban  (ELIQUIS ) 5 MG TABS tablet Completed Course     Physical Exam:    08/22/2023   10:36 AM 07/11/2023    9:24 AM 04/19/2023   10:15 AM  Vitals with BMI  Height 5' 5 5' 5 5' 6.93  Weight 142 lbs 10 oz 147 lbs 141 lbs 13 oz  BMI 23.73 24.46 22.26  Systolic 100 108 161  Diastolic 60 70  72  Pulse 72 54 62  Vital signs reviewed.  Nursing notes reviewed. Weight trend reviewed. Physical Exam General Appearance:  No acute distress appreciable.   Well-groomed, healthy-appearing male.  Well proportioned with no abnormal fat distribution.  Good muscle tone. Pulmonary:  Normal work of breathing at rest, no respiratory distress apparent. SpO2: 98 %  Musculoskeletal: All extremities are intact.   Neurological:  Awake, alert, oriented, and engaged.  No obvious focal neurological deficits or cognitive impairments.  Sensorium seems unclouded.   Speech is clear and coherent with logical content. Psychiatric:  Appropriate mood, pleasant and cooperative demeanor, thoughtful and engaged during the exam Physical Exam MEASUREMENTS: Weight- 139.     Results for orders placed or performed in visit on 08/22/23  CBC with Differential/Platelet  Result Value Ref Range   WBC 3.0 (L) 4.0 - 10.5 K/uL   RBC 3.17 (L) 4.22 - 5.81 Mil/uL   Hemoglobin 10.3 (L) 13.0 - 17.0 g/dL   HCT 16.1 (L) 09.6 - 04.5 %   MCV 95.6 78.0 - 100.0 fl   MCHC 34.0 30.0 - 36.0 g/dL   RDW 40.9 81.1 - 91.4 %   Platelets 172.0 150.0 - 400.0 K/uL   Neutrophils Relative % 64.1 43.0 - 77.0 %   Lymphocytes Relative 14.7 12.0 - 46.0 %   Monocytes Relative 16.2 (H) 3.0 - 12.0 %   Eosinophils Relative 3.9 0.0 - 5.0 %   Basophils Relative 1.1 0.0 - 3.0 %   Neutro Abs 1.9 1.4 - 7.7 K/uL   Lymphs Abs 0.4 (L) 0.7 - 4.0 K/uL   Monocytes Absolute 0.5 0.1 - 1.0 K/uL   Eosinophils Absolute 0.1 0.0 - 0.7 K/uL   Basophils Absolute 0.0 0.0 - 0.1 K/uL  Comp Met (CMET)  Result Value Ref Range   Sodium 139 135 - 145 mEq/L   Potassium 4.5 3.5 - 5.1 mEq/L   Chloride 104 96 - 112 mEq/L   CO2 30 19 - 32 mEq/L   Glucose, Bld 89 70 - 99 mg/dL   BUN 28 (H) 6 - 23 mg/dL   Creatinine, Ser 7.82 0.40 - 1.50 mg/dL   Total Bilirubin 0.4 0.2 - 1.2 mg/dL   Alkaline Phosphatase 43 39 - 117 U/L   AST 21 0 - 37 U/L   ALT 15 0 - 53 U/L   Total Protein 6.3 6.0 - 8.3 g/dL   Albumin  3.6 3.5 - 5.2 g/dL   GFR 95.62 (L) >13.08 mL/min   Calcium  8.5 8.4 - 10.5 mg/dL  Magnesium  Result Value Ref Range   Magnesium 1.9 1.5 - 2.5 mg/dL   Office Visit on 65/78/4696  Component Date Value Ref Range Status   WBC 08/22/2023 3.0 (L)  4.0 - 10.5 K/uL Final   RBC 08/22/2023 3.17 (L)  4.22 - 5.81 Mil/uL Final   Hemoglobin 08/22/2023 10.3 (L)  13.0 - 17.0 g/dL  Final   HCT 29/52/8413 30.3 (L)  39.0 - 52.0 % Final   MCV 08/22/2023 95.6  78.0 - 100.0 fl Final   MCHC 08/22/2023 34.0  30.0 - 36.0 g/dL Final   RDW 24/40/1027 13.6  11.5 - 15.5 % Final   Platelets 08/22/2023 172.0  150.0 - 400.0 K/uL Final   Neutrophils Relative % 08/22/2023 64.1  43.0 - 77.0 % Final   Lymphocytes Relative 08/22/2023 14.7  12.0 - 46.0 % Final   Monocytes Relative 08/22/2023 16.2 (H)  3.0 - 12.0 % Final   Eosinophils Relative 08/22/2023 3.9  0.0 -  5.0 % Final   Basophils Relative 08/22/2023 1.1  0.0 - 3.0 % Final   Neutro Abs 08/22/2023 1.9  1.4 - 7.7 K/uL Final   Lymphs Abs 08/22/2023 0.4 (L)  0.7 - 4.0 K/uL Final   Monocytes Absolute 08/22/2023 0.5  0.1 - 1.0 K/uL Final   Eosinophils Absolute 08/22/2023 0.1  0.0 - 0.7 K/uL Final   Basophils Absolute 08/22/2023 0.0  0.0 - 0.1 K/uL Final   Sodium 08/22/2023 139  135 - 145 mEq/L Final   Potassium 08/22/2023 4.5  3.5 - 5.1 mEq/L Final   Chloride 08/22/2023 104  96 - 112 mEq/L Final   CO2 08/22/2023 30  19 - 32 mEq/L Final   Glucose, Bld 08/22/2023 89  70 - 99 mg/dL Final   BUN 16/12/9602 28 (H)  6 - 23 mg/dL Final   Creatinine, Ser 08/22/2023 1.28  0.40 - 1.50 mg/dL Final   Total Bilirubin 08/22/2023 0.4  0.2 - 1.2 mg/dL Final   Alkaline Phosphatase 08/22/2023 43  39 - 117 U/L Final   AST 08/22/2023 21  0 - 37 U/L Final   ALT 08/22/2023 15  0 - 53 U/L Final   Total Protein 08/22/2023 6.3  6.0 - 8.3 g/dL Final   Albumin  08/22/2023 3.6  3.5 - 5.2 g/dL Final   GFR 54/11/8117 54.63 (L)  >60.00 mL/min Final   Calcium  08/22/2023 8.5  8.4 - 10.5 mg/dL Final   Magnesium 14/78/2956 1.9  1.5 - 2.5 mg/dL Final  Office Visit on 04/19/2023  Component Date Value Ref Range Status   Cholesterol 04/19/2023 169  0 - 200 mg/dL Final   Triglycerides 21/30/8657 69.0  0.0 - 149.0 mg/dL Final   HDL 84/69/6295 71.60  >39.00 mg/dL Final   VLDL 28/41/3244 13.8  0.0 - 40.0 mg/dL Final   LDL Cholesterol 04/19/2023 84  0 - 99 mg/dL Final    Total CHOL/HDL Ratio 04/19/2023 2   Final   NonHDL 04/19/2023 97.69   Final   Sodium 04/19/2023 138  135 - 145 mEq/L Final   Potassium 04/19/2023 4.1  3.5 - 5.1 mEq/L Final   Chloride 04/19/2023 100  96 - 112 mEq/L Final   CO2 04/19/2023 28  19 - 32 mEq/L Final   Glucose, Bld 04/19/2023 108 (H)  70 - 99 mg/dL Final   BUN 03/10/7251 24 (H)  6 - 23 mg/dL Final   Creatinine, Ser 04/19/2023 1.06  0.40 - 1.50 mg/dL Final   Total Bilirubin 04/19/2023 0.5  0.2 - 1.2 mg/dL Final   Alkaline Phosphatase 04/19/2023 55  39 - 117 U/L Final   AST 04/19/2023 22  0 - 37 U/L Final   ALT 04/19/2023 15  0 - 53 U/L Final   Total Protein 04/19/2023 6.4  6.0 - 8.3 g/dL Final   Albumin  04/19/2023 3.7  3.5 - 5.2 g/dL Final   GFR 66/44/0347 68.67  >60.00 mL/min Final   Calcium  04/19/2023 8.7  8.4 - 10.5 mg/dL Final   WBC 42/59/5638 4.5  4.0 - 10.5 K/uL Final   RBC 04/19/2023 2.92 (L)  4.22 - 5.81 Mil/uL Final   Hemoglobin 04/19/2023 10.1 (L)  13.0 - 17.0 g/dL Final   HCT 75/64/3329 29.5 (L)  39.0 - 52.0 % Final   MCV 04/19/2023 101.2 (H)  78.0 - 100.0 fl Final   MCHC 04/19/2023 34.1  30.0 - 36.0 g/dL Final   RDW 51/88/4166 13.8  11.5 - 15.5 % Final   Platelets 04/19/2023 168.0  150.0 -  400.0 K/uL Final   Neutrophils Relative % 04/19/2023 74.3  43.0 - 77.0 % Final   Lymphocytes Relative 04/19/2023 9.8 (L)  12.0 - 46.0 % Final   Monocytes Relative 04/19/2023 11.1  3.0 - 12.0 % Final   Eosinophils Relative 04/19/2023 3.7  0.0 - 5.0 % Final   Basophils Relative 04/19/2023 1.1  0.0 - 3.0 % Final   Neutro Abs 04/19/2023 3.3  1.4 - 7.7 K/uL Final   Lymphs Abs 04/19/2023 0.4 (L)  0.7 - 4.0 K/uL Final   Monocytes Absolute 04/19/2023 0.5  0.1 - 1.0 K/uL Final   Eosinophils Absolute 04/19/2023 0.2  0.0 - 0.7 K/uL Final   Basophils Absolute 04/19/2023 0.0  0.0 - 0.1 K/uL Final   TSH 04/19/2023 12.300 (H)  0.450 - 4.500 uIU/mL Final   PSA 04/19/2023 1.79  0.10 - 4.00 ng/mL Final   Magnesium 04/19/2023 1.8  1.5 - 2.5  mg/dL Final   N8,GNFA (Direct) 04/19/2023 0.86  0.82 - 1.77 ng/dL Final  Hospital Outpatient Visit on 10/27/2022  Component Date Value Ref Range Status   Glucose-Capillary 10/27/2022 75  70 - 99 mg/dL Final  Appointment on 21/30/8657  Component Date Value Ref Range Status   Recommendations-PTGENE: 07/23/2022 Comment   Final   Reviewed by: 07/23/2022 Melodie Spry, PhD   Final   Recommendations-F5LEID: 07/23/2022 Comment   Final   Reviewed By: 07/23/2022 Melodie Spry, PhD   Final   Beta-2  Glyco I IgG 07/23/2022 <9  0 - 20 GPI IgG units Final   Beta-2 -Glycoprotein I IgM 07/23/2022 21  0 - 32 GPI IgM units Final   Beta-2 -Glycoprotein I IgA 07/23/2022 <9  0 - 25 GPI IgA units Final   Anticardiolipin IgG 07/23/2022 <9  0 - 14 GPL U/mL Final   Anticardiolipin IgM 07/23/2022 9  0 - 12 MPL U/mL Final   Anticardiolipin IgA 07/23/2022 <9  0 - 11 APL U/mL Final   Sodium 07/23/2022 138  135 - 145 mmol/L Final   Potassium 07/23/2022 4.6  3.5 - 5.1 mmol/L Final   Chloride 07/23/2022 102  98 - 111 mmol/L Final   CO2 07/23/2022 31  22 - 32 mmol/L Final   Glucose, Bld 07/23/2022 109 (H)  70 - 99 mg/dL Final   BUN 84/69/6295 17  8 - 23 mg/dL Final   Creatinine 28/41/3244 0.98  0.61 - 1.24 mg/dL Final   Calcium  07/23/2022 9.1  8.9 - 10.3 mg/dL Final   Total Protein 03/10/7251 6.9  6.5 - 8.1 g/dL Final   Albumin  07/23/2022 4.0  3.5 - 5.0 g/dL Final   AST 66/44/0347 22  15 - 41 U/L Final   ALT 07/23/2022 18  0 - 44 U/L Final   Alkaline Phosphatase 07/23/2022 75  38 - 126 U/L Final   Total Bilirubin 07/23/2022 0.5  0.3 - 1.2 mg/dL Final   GFR, Estimated 07/23/2022 >60  >60 mL/min Final   Anion gap 07/23/2022 5  5 - 15 Final   WBC Count 07/23/2022 6.0  4.0 - 10.5 K/uL Final   RBC 07/23/2022 4.61  4.22 - 5.81 MIL/uL Final   Hemoglobin 07/23/2022 15.0  13.0 - 17.0 g/dL Final   HCT 42/59/5638 42.6  39.0 - 52.0 % Final   MCV 07/23/2022 92.4  80.0 - 100.0 fL Final   MCH 07/23/2022 32.5  26.0 - 34.0 pg Final    MCHC 07/23/2022 35.2  30.0 - 36.0 g/dL Final   RDW 75/64/3329 12.1  11.5 - 15.5 %  Final   Platelet Count 07/23/2022 180  150 - 400 K/uL Final   nRBC 07/23/2022 0.0  0.0 - 0.2 % Final   Neutrophils Relative % 07/23/2022 69  % Final   Neutro Abs 07/23/2022 4.2  1.7 - 7.7 K/uL Final   Lymphocytes Relative 07/23/2022 18  % Final   Lymphs Abs 07/23/2022 1.1  0.7 - 4.0 K/uL Final   Monocytes Relative 07/23/2022 9  % Final   Monocytes Absolute 07/23/2022 0.5  0.1 - 1.0 K/uL Final   Eosinophils Relative 07/23/2022 3  % Final   Eosinophils Absolute 07/23/2022 0.2  0.0 - 0.5 K/uL Final   Basophils Relative 07/23/2022 1  % Final   Basophils Absolute 07/23/2022 0.0  0.0 - 0.1 K/uL Final   Immature Granulocytes 07/23/2022 0  % Final   Abs Immature Granulocytes 07/23/2022 0.02  0.00 - 0.07 K/uL Final  No image results found. No results found.      04/19/2023   10:41 AM  PHQ 2/9 Scores  PHQ - 2 Score 0   Results RADIOLOGY PET scan: No signs of recurrence (06/2023) MRI: Rotator cuff tear (07/2023)  DIAGNOSTIC EGD: Esophagitis    Assessment & Plan Esophagitis He has esophagitis with potential Barrett's esophagus. A previous endoscopy suggested probable dysplasia and pre-Barrett's esophagus. He requests a repeat endoscopy to assess for Barrett's esophagus progression and prefers referral within the Epic system for better data integration. Refer to a GI specialist at [facility] for EGD to rule out Barrett's esophagus. H/O oral cancer Include to gastrointestinal ref. Malignant neoplasm of base of tongue (HCC) Include to gastrointestinal ref  Injury of right rotator cuff, subsequent encounter Updated problem overview for this problem to improve longitudinal management  He has a rotator cuff tear from a fall in May. MRI confirmed the tear. He is undergoing physical therapy with improvement. Orthopedic evaluation at [facility] suggested no surgery needed. Continue physical therapy. TSH elevation He is  on levothyroxine, possibly secondary to cancer treatment. Symptoms of feeling cold have improved. No excessive energy or other hyperthyroid symptoms. Need to recheck thyroid function to ensure stability. Order TSH and free T4 blood tests and continue levothyroxine 75 mcg daily. Hypomagnesemia Will order lab testing to guide management.  History of DVT (deep vein thrombosis) He has recurrent DVT associated with cancer, with a history of a blood clot in the left calf in March-April 2024. Previously on Eliquis , which was discontinued two months ago per cardiologist's advice. The hematologist recommends indefinite anticoagulation due to cancer association and recurrence. He is now cancer-free as per a recent PET scan at [facility]. After discussing the risks and benefits of stopping Eliquis , including the potential for a blood clot as an early indicator of cancer recurrence, he agrees to stop Eliquis  with close monitoring for blood clots. Acknowledged risk of another blood clot, which could indicate cancer recurrence, but unlikely to be immediately fatal. Discontinue Eliquis  and monitor for signs of blood clots. Complex shared decision making resulted in patient deciding to not continue(s) with eliquis - although hematology had advised continue indefinitely, based on presumed cancer resolution.   General Health Maintenance   Pneumonia vaccination status is likely up to date. Weight loss noted, but he has no desire for weight gain medication. B12 and magnesium levels to be rechecked. Recheck B12 and magnesium levels and confirm pneumonia vaccination status.         Orders Placed in Encounter:    Ambulatory referral to Gastroenterology  Comments: Prior esophagoduodenoscopy showed probably dysplasia and pre-barretts he wants [facility] doctor to repeat esophagoduodenoscopy to rule out barretts.    TSH + free T4         CBC with Differential/Platelet         Comp Met (CMET)          Thyroid Panel With TSH         Magnesium              This document was synthesized by artificial intelligence ([employer]bridge) using HIPAA-compliant recording of the clinical interaction;   We discussed the use of AI scribe software for clinical note transcription with the patient, who gave verbal consent to proceed. additional Info: This encounter employed state-of-the-art, real-time, collaborative documentation. The patient actively reviewed and assisted in updating their electronic medical record on a shared screen, ensuring transparency and facilitating joint problem-solving for the problem list, overview, and plan. This approach promotes accurate, informed care. The treatment plan was discussed and reviewed in detail, including medication safety, potential side effects, and all patient questions. We confirmed understanding and comfort with the plan. Follow-up instructions were established, including contacting the office for any concerns, returning if symptoms worsen, persist, or new symptoms develop, and precautions for potential emergency department visits.

## 2023-08-22 NOTE — Assessment & Plan Note (Addendum)
 Updated problem overview for this problem to improve longitudinal management  He has a rotator cuff tear from a fall in May. MRI confirmed the tear. He is undergoing physical therapy with improvement. Orthopedic evaluation at [facility] suggested no surgery needed. Continue physical therapy.

## 2023-08-22 NOTE — Assessment & Plan Note (Signed)
 He has recurrent DVT associated with cancer, with a history of a blood clot in the left calf in March-April 2024. Previously on Eliquis , which was discontinued two months ago per cardiologist's advice. The hematologist recommends indefinite anticoagulation due to cancer association and recurrence. He is now cancer-free as per a recent PET scan at [facility]. After discussing the risks and benefits of stopping Eliquis , including the potential for a blood clot as an early indicator of cancer recurrence, he agrees to stop Eliquis  with close monitoring for blood clots. Acknowledged risk of another blood clot, which could indicate cancer recurrence, but unlikely to be immediately fatal. Discontinue Eliquis  and monitor for signs of blood clots. Complex shared decision making resulted in patient deciding to not continue(s) with eliquis - although hematology had advised continue indefinitely, based on presumed cancer resolution.

## 2023-08-22 NOTE — Assessment & Plan Note (Signed)
 Will order lab testing to guide management.

## 2023-08-25 LAB — TIQ- AMBIGUOUS ORDER

## 2023-08-25 LAB — THYROID PROFILE - CHCC
Free Thyroxine Index: 1.6 (ref 1.4–3.8)
T3 Uptake: 35 % (ref 22–35)
T4, Total: 4.6 ug/dL — ABNORMAL LOW (ref 4.9–10.5)

## 2023-08-25 LAB — TEST AUTHORIZATION

## 2023-08-26 ENCOUNTER — Ambulatory Visit: Payer: Self-pay | Admitting: Internal Medicine

## 2023-08-26 NOTE — Progress Notes (Signed)
 We need to sign the verbal order sheet- please print for sig and fax

## 2023-09-05 ENCOUNTER — Ambulatory Visit: Admitting: Internal Medicine

## 2023-09-08 ENCOUNTER — Ambulatory Visit: Admitting: Podiatry

## 2023-09-08 ENCOUNTER — Ambulatory Visit (INDEPENDENT_AMBULATORY_CARE_PROVIDER_SITE_OTHER)

## 2023-09-08 ENCOUNTER — Encounter: Payer: Self-pay | Admitting: Podiatry

## 2023-09-08 VITALS — Ht 65.0 in | Wt 142.0 lb

## 2023-09-08 DIAGNOSIS — M722 Plantar fascial fibromatosis: Secondary | ICD-10-CM

## 2023-09-08 DIAGNOSIS — M2042 Other hammer toe(s) (acquired), left foot: Secondary | ICD-10-CM

## 2023-09-08 DIAGNOSIS — M21619 Bunion of unspecified foot: Secondary | ICD-10-CM

## 2023-09-08 DIAGNOSIS — M2041 Other hammer toe(s) (acquired), right foot: Secondary | ICD-10-CM

## 2023-09-08 DIAGNOSIS — Q828 Other specified congenital malformations of skin: Secondary | ICD-10-CM

## 2023-09-08 NOTE — Progress Notes (Signed)
 Subjective:   Patient ID: Dwayne Harper, male   DOB: 78 y.o.   MRN: [id number]   HPI Patient presents with a lot of pain in the left forefoot and presents with caregiver who states that it has been going on about 4 months.  He did have orthotics and needs a new pair made they were done a number of years ago   ROS      Objective:  Physical Exam  Neurovascular status intact significant structural problem deformity bar bilateral with rigid second toes left foot that is moderately painful when pressed and has a severe lesion subsecond metatarsal left with pressure against the area.  Good digital perfusion well-oriented x 3     Assessment:  Keratotic lesion with pressure against the second metatarsal left with structural bunion and hammertoe deformity part of the pathology     Plan:  H&P education rendered and today I went ahead and we will get a focus on the lesion discussed orthotics to offload weight but first I want to make sure we get the pressure off this area.  Sharp sterile courtesy debridement accomplished no iatrogenic bleeding discussed orthotics and discussed possible digital fusion osteotomy if symptoms persist or I cannot get it better  X-rays indicate significant bunion deformity left foot with elevated second toe rigid contracture

## 2023-09-19 ENCOUNTER — Telehealth: Payer: Self-pay

## 2023-09-19 NOTE — Telephone Encounter (Signed)
 Faxed over a paper to [employer] for clear to fly from provider at [phone].

## 2023-09-28 ENCOUNTER — Ambulatory Visit: Admitting: Podiatry

## 2023-11-21 ENCOUNTER — Encounter: Payer: Self-pay | Admitting: Internal Medicine

## 2023-11-21 ENCOUNTER — Ambulatory Visit: Admitting: Internal Medicine

## 2023-11-21 VITALS — BP 120/62 | HR 69 | Temp 98.0°F | Ht 65.0 in | Wt 142.2 lb

## 2023-11-21 DIAGNOSIS — I2510 Atherosclerotic heart disease of native coronary artery without angina pectoris: Secondary | ICD-10-CM

## 2023-11-21 DIAGNOSIS — E785 Hyperlipidemia, unspecified: Secondary | ICD-10-CM | POA: Diagnosis not present

## 2023-11-21 DIAGNOSIS — D7589 Other specified diseases of blood and blood-forming organs: Secondary | ICD-10-CM

## 2023-11-21 DIAGNOSIS — M4003 Postural kyphosis, cervicothoracic region: Secondary | ICD-10-CM | POA: Diagnosis not present

## 2023-11-21 DIAGNOSIS — R1312 Dysphagia, oropharyngeal phase: Secondary | ICD-10-CM

## 2023-11-21 DIAGNOSIS — K22711 Barrett's esophagus with high grade dysplasia: Secondary | ICD-10-CM

## 2023-11-21 DIAGNOSIS — R7989 Other specified abnormal findings of blood chemistry: Secondary | ICD-10-CM | POA: Diagnosis not present

## 2023-11-21 DIAGNOSIS — E039 Hypothyroidism, unspecified: Secondary | ICD-10-CM

## 2023-11-21 DIAGNOSIS — N401 Enlarged prostate with lower urinary tract symptoms: Secondary | ICD-10-CM

## 2023-11-21 DIAGNOSIS — Z23 Encounter for immunization: Secondary | ICD-10-CM

## 2023-11-21 LAB — CBC WITH DIFFERENTIAL/PLATELET
Basophils Absolute: 0 K/uL (ref 0.0–0.1)
Basophils Relative: 0.8 % (ref 0.0–3.0)
Eosinophils Absolute: 0.2 K/uL (ref 0.0–0.7)
Eosinophils Relative: 5.3 % — ABNORMAL HIGH (ref 0.0–5.0)
HCT: 32 % — ABNORMAL LOW (ref 39.0–52.0)
Hemoglobin: 10.9 g/dL — ABNORMAL LOW (ref 13.0–17.0)
Lymphocytes Relative: 15.2 % (ref 12.0–46.0)
Lymphs Abs: 0.6 K/uL — ABNORMAL LOW (ref 0.7–4.0)
MCHC: 34.2 g/dL (ref 30.0–36.0)
MCV: 94.1 fl (ref 78.0–100.0)
Monocytes Absolute: 0.6 K/uL (ref 0.1–1.0)
Monocytes Relative: 16 % — ABNORMAL HIGH (ref 3.0–12.0)
Neutro Abs: 2.3 K/uL (ref 1.4–7.7)
Neutrophils Relative %: 62.7 % (ref 43.0–77.0)
Platelets: 148 K/uL — ABNORMAL LOW (ref 150.0–400.0)
RBC: 3.4 Mil/uL — ABNORMAL LOW (ref 4.22–5.81)
RDW: 12.3 % (ref 11.5–15.5)
WBC: 3.7 K/uL — ABNORMAL LOW (ref 4.0–10.5)

## 2023-11-21 LAB — B12 AND FOLATE PANEL
Folate: >22.9 ng/mL (ref >5.9)
Vitamin B-12: 493 pg/mL (ref 211–911)

## 2023-11-21 LAB — LIPID PANEL
Cholesterol: 154 mg/dL (ref 0–200)
HDL: 66.9 mg/dL (ref >39.00)
LDL Cholesterol: 78 mg/dL (ref 0–99)
NonHDL: 86.91
Total CHOL/HDL Ratio: 2
Triglycerides: 46 mg/dL (ref 0.0–149.0)
VLDL: 9.2 mg/dL (ref 0.0–40.0)

## 2023-11-21 LAB — MICROALBUMIN / CREATININE URINE RATIO
Creatinine,U: 83.3 mg/dL
Microalb Creat Ratio: UNDETERMINED mg/g (ref 0.0–30.0)
Microalb, Ur: <0.7 mg/dL

## 2023-11-21 LAB — URIC ACID: Uric Acid, Serum: 5 mg/dL (ref 4.0–7.8)

## 2023-11-21 LAB — MAGNESIUM: Magnesium: 1.8 mg/dL (ref 1.5–2.5)

## 2023-11-21 MED ORDER — COVID-19 MRNA VACC (MODERNA) 50 MCG/0.5ML IM SUSY: 0.5000 mL | PREFILLED_SYRINGE | Freq: Once | INTRAMUSCULAR | 0 refills | Status: AC

## 2023-11-21 MED ORDER — ASPIRIN 81 MG PO TBEC: 81.0000 mg | DELAYED_RELEASE_TABLET | Freq: Every day | ORAL | Status: AC

## 2023-11-21 NOTE — Patient Instructions (Signed)
 It was a pleasure seeing you today! Your health and satisfaction are our top priorities.  Bernardino Cone, MD  VISIT SUMMARY: Today, we reviewed your thyroid  function, recent diagnostic tests, and overall health. We discussed your current medications and their effectiveness, particularly focusing on your thyroid  management. We also reviewed your history of cancer, coronary artery disease, and other health conditions.  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 This condition can cause fatigue and feeling cold. To improve the effectiveness of your levothyroxine, take it on an empty stomach with water and separate from other medications. We will check your thyroid  function and adjust your dosage if needed.  -OROPHARYNGEAL CANCER (LEFT TONGUE BASE), IN REMISSION: Your oropharyngeal cancer is in remission, meaning there are no signs of the disease currently. Continue regular follow-ups with your oncology team. We will discuss with the [facility] team about the cystic nodule in your thyroid  and consider a biopsy if recommended.  -CORONARY ARTERY DISEASE: Coronary artery disease involves the narrowing of the heart's blood vessels, which can lead to heart attacks. To reduce your risk, start taking a low-dose aspirin  daily unless advised otherwise by the [facility] team.  -HYPERLIPIDEMIA: Hyperlipidemia means having high levels of fats in your blood, which can increase the risk of heart disease. Your condition is well-controlled, but we will order a lipid panel to monitor your cholesterol levels.  -BARRETT'S ESOPHAGUS WITH HIATAL HERNIA: Barrett's esophagus with a hiatal hernia means that part of your stomach pushes up into your chest, and the lining of your esophagus has changed. Recent tests show no active disease, so no changes are needed at this time.  -BENIGN PROSTATIC  HYPERPLASIA WITH LOWER URINARY TRACT SYMPTOMS: Benign prostatic hyperplasia is an enlarged prostate that can cause frequent urination at night. You are currently managing this with Flomax  and Vesicare. We discussed surgical options, but for now, continue with your current medications and consult a urologist if your symptoms worsen.  INSTRUCTIONS: Please follow up with the [facility] team regarding the cystic nodule in your thyroid  and consider a biopsy if they recommend it. Continue regular follow-ups with your oncology team and start taking a low-dose aspirin  daily unless advised otherwise by the [facility] team. We will also order thyroid  function tests and a lipid panel to monitor your conditions.  Your Providers PCP: Cone Bernardino MATSU, MD,  [phone]) Referring Provider: Cone Bernardino MATSU, MD,  [phone]) Care Team Provider: Anner Alm ORN, MD,  [phone]) Care Team Provider: Janit Lamar PARAS, MD,  [phone]) Care Team Provider: Clinic, [place],  [phone])  NEXT STEPS: [x]  Early Intervention: Schedule sooner appointment, call our on-call services, or go to emergency room if there is any significant Increase in pain or discomfort New or worsening symptoms Sudden or severe changes in your health [x]  Flexible Follow-Up: We recommend a No follow-ups on file. for optimal routine care. This allows for progress monitoring and treatment adjustments. [x]  Preventive Care: Schedule your annual preventive care visit! It's typically covered by insurance and helps identify potential health issues early. [x]  Lab & X-ray Appointments: Incomplete tests scheduled today, or call to schedule. X-rays: [facility] (M-F, 8:30am-noon or 1pm-5pm). [x]  Medical Information Release: Sign a release form at front desk to obtain relevant medical information we don't have.  MAKING THE MOST OF OUR FOCUSED 20 MINUTE APPOINTMENTS: [x]   Clearly state your top concerns at the beginning of  the visit to focus our discussion [x]   If you anticipate you will need more time, please inform the front desk during scheduling - we can book multiple appointments in the same week. [x]   If you have transportation problems- use our convenient video appointments or ask about transportation support. [x]   We can get down to business faster if you use MyChart to update information before the visit and submit non-urgent questions before your visit. Thank you for taking the time to provide details through MyChart.  Let our nurse know and she can import this information into your encounter documents.  Arrival and Wait Times: [x]   Arriving on time ensures that everyone receives prompt attention. [x]   Early morning (8a) and afternoon (1p) appointments tend to have shortest wait times. [x]   Unfortunately, we cannot delay appointments for late arrivals or hold slots during phone calls.  Getting Answers and Following Up [x]   Simple Questions & Concerns: For quick questions or basic follow-up after your visit, reach us  at [phone] or MyChart messaging. [x]   Complex Concerns: If your concern is more complex, scheduling an appointment might be best. Discuss this with the staff to find the most suitable option. [x]   Lab & Imaging Results: We'll contact you directly if results are abnormal or you don't use MyChart. Most normal results will be on MyChart within 2-3 business days, with a review message from Dr. Jesus. Haven't heard back in 2 weeks? Need results sooner? Contact us  at [phone]. [x]   Referrals: Our referral coordinator will manage specialist referrals. The specialist's office should contact you within 2 weeks to schedule an appointment. Call us  if you haven't heard from them after 2 weeks.  Staying Connected [x]   MyChart: Activate your MyChart for the fastest way to access results and message us . See the last page of this paperwork for instructions on how to activate.  Bring to Your  Next Appointment [x]   Medications: Please bring all your medication bottles to your next appointment to ensure we have an accurate record of your prescriptions. [x]   Health Diaries: If you're monitoring any health conditions at home, keeping a diary of your readings can be very helpful for discussions at your next appointment.  Billing [x]   X-ray & Lab Orders: These are billed by separate companies. Contact the invoicing company directly for questions or concerns. [x]   Visit Charges: Discuss any billing inquiries with our administrative services team.  Your Satisfaction Matters [x]   Share Your Experience: We strive for your satisfaction! If you have any complaints, or preferably compliments, please let Dr. Jesus know directly or contact our Practice Administrators, Manuelita Rubin or [employer], by asking at the front desk.   Reviewing Your Records [x]   Review this early draft of your clinical encounter notes below and the final encounter summary tomorrow on MyChart after its been completed.  All orders placed so far are visible here: Acquired hypothyroidism -     TSH + free T4  Need for COVID-19 vaccine -     COVID-19 mRNA Vacc (Moderna); Inject 0.5 mLs into the muscle once for 1 dose.  Dispense: 0.5 mL; Refill: 0  Hypomagnesemia -     Magnesium  Hyperlipidemia with target LDL less than 70 -     Aspirin ; Take 1 tablet (81 mg total) by mouth daily. Swallow whole. -     Lipid panel  Macrocytosis -     CBC with Differential/Platelet -     B12 and Folate Panel  Elevated  serum creatinine -     Uric acid -     Microalbumin / creatinine urine ratio  Postural kyphosis of cervicothoracic region  Oropharyngeal dysphagia  Coronary artery disease, non-occlusive  Barrett's esophagus with high grade dysplasia  Benign prostatic hyperplasia with lower urinary tract symptoms, symptom details unspecified  Other orders -     Flu vaccine HIGH DOSE PF(Fluzone Trivalent)

## 2023-11-21 NOTE — Assessment & Plan Note (Signed)
Updated problem overview for this problem to improve longitudinal management  

## 2023-11-21 NOTE — Assessment & Plan Note (Signed)
 Oropharyngeal cancer (left tongue base), in remission   Oropharyngeal cancer at the left tongue base is in remission. Recent imaging shows resolution of the mass with no residual disease, consistent with the high cure rate for head and neck cancers. Continue regular follow-up with the oncology team. Discuss with the [facility] team regarding the low suspicion cystic nodule in the mid lobe of the right thyroid  and consider biopsy if recommended.

## 2023-11-21 NOTE — Progress Notes (Signed)
 ==============================  [facility]: [phone]   -- Medical Office Visit --  Patient: Dwayne Harper      Age: 78 y.o.       Sex:  male  Date:   11/21/2023 Today's Healthcare Provider: Bernardino KANDICE Cone, MD  ==============================   Chief Complaint: Hypothyroidism  Discussed the use of AI scribe software for clinical note transcription with the patient, who gave verbal consent to proceed.  History of Present Illness 78 year old male who presents for thyroid  monitoring and review of recent diagnostic tests.  He takes levothyroxine for thyroid  issues but has been taking it with omeprazole, which may interfere with its effectiveness. He experiences occasional fatigue and feeling cold, which he attributes to thyroid  issues. No weight gain, constipation, swelling, anxiousness, excessive energy, tremulousness, palpitations, sweating, weight loss, diarrhea, or changes in skin, nails, or hair. His last thyroid  check was in June.  He has a history of esophagitis and underwent an endoscopy in August, which showed no significant findings. His esophagus has been fine since then. He also has a history of a hiatal hernia and Barrett's esophagus, confirmed by an EGD with biopsies in August, which showed no active issues.  He has experienced significant weight loss, from 180s in 2021 to 141-142 lbs over the past seven months, which has stabilized recently. He is pleased with this weight stabilization.  He underwent a CT scan in August at the [facility], which he believes showed no signs of cancer recurrence. He has a history of cancer but reports being told he is clear of cancer currently. He has been visiting [facility] every three months for follow-up.  He was previously on Eliquis  but has since stopped. He reports no current issues with blood clots or leg pain.  He takes Flomax  and Vesicare for bladder issues, reporting nocturia three  times per night. No recent chest pain.  Wt Readings from Last 50 Encounters:  11/21/23 142 lb 3.2 oz (64.5 kg)  09/08/23 142 lb (64.4 kg)  08/22/23 142 lb 9.6 oz (64.7 kg)  07/11/23 147 lb (66.7 kg)  04/19/23 141 lb 12.8 oz (64.3 kg)  07/23/22 154 lb 8 oz (70.1 kg)  07/02/22 158 lb 6.4 oz (71.8 kg)  05/28/22 159 lb (72.1 kg)  08/24/19 187 lb 6.3 oz (85 kg)  08/19/19 187 lb 6.3 oz (85 kg)  12/07/16 185 lb (83.9 kg)  12/08/15 184 lb 3.2 oz (83.6 kg)  11/21/15 187 lb 12.8 oz (85.2 kg)  11/08/15 183 lb 6.4 oz (83.2 kg)  11/05/15 188 lb (85.3 kg)  10/22/15 186 lb (84.4 kg)  09/29/15 183 lb 6.4 oz (83.2 kg)   BMI Readings from Last 50 Encounters:  11/21/23 23.66 kg/m  09/08/23 23.63 kg/m  08/22/23 23.73 kg/m  07/11/23 24.46 kg/m  04/19/23 22.26 kg/m  07/23/22 24.25 kg/m  07/02/22 27.19 kg/m  05/28/22 26.46 kg/m  08/24/19 28.49 kg/m  08/19/19 28.49 kg/m  12/07/16 28.13 kg/m  12/08/15 27.60 kg/m  11/21/15 28.14 kg/m  11/08/15 27.89 kg/m  11/05/15 28.59 kg/m  10/22/15 27.87 kg/m  09/29/15 27.48 kg/m   Interim history: fatigue, feeling cold and cold intolerance    Latest Ref Rng & Units 08/22/2023   11:51 AM 04/19/2023   12:05 PM 04/20/2007    8:07 AM  THYROID   TSH 0.450 - 4.500 uIU/mL  12.300  1.62   Thyroxine (T4) 4.9 - 10.5 mcg/dL 4.6     U5,Qmzz (Direct) 0.82 -  1.77 ng/dL  9.13    T3 Uptake 22 - 35 % 35     Background Reviewed: Problem List: has History of testicular cancer; History of DVT (deep vein thrombosis); Hyperlipidemia with target LDL less than 70; BPH (benign prostatic hyperplasia); ED (erectile dysfunction) of organic origin; Hypogonadism in male; Voiding dysfunction; Coronary artery disease, non-occlusive; Cervical lymphadenopathy; Esophagitis; Hypomagnesemia; Malignant neoplasm of base of tongue (HCC); 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; Personal history of irradiation; History of cholecystectomy; Unintentional weight loss; History of colon  polyps; Hypothyroidism; H/O oral cancer; Injury of right rotator cuff; Macrocytosis; Elevated serum creatinine; Postural kyphosis of cervicothoracic region; Oropharyngeal dysphagia; and Barrett's esophagus with high grade dysplasia on their problem list. Past Medical History:  has a past medical history of Abnormal stress test (11/06/2015), Acute deep vein thrombosis (DVT) of popliteal vein of left lower extremity (HCC) (07/08/2022), Acute gangrenous cholecystitis s/p lap cholecystectomy 08/23/2019 (08/23/2019), Asthma, Barrett esophagus, BPH (benign prostatic hyperplasia), Cancer (HCC) (testicular cancer 1997), Cholecystitis with cholelithiasis (08/22/2019), Concussion with no loss of consciousness, DVT (deep venous thrombosis) (HCC) (09/2015), ED (erectile dysfunction), Empyema of gallbladder (08/23/2019), GERD (gastroesophageal reflux disease), Hiatal hernia, History of gout, Hyperlipidemia (11/08/2015), Pneumonia (1950s), Scalp hematoma, Syncope (09/26/2015), Testicular cancer (HCC) (1997), Thrombocytopenia (HCC) (09/26/2015), and Tubular adenoma. Past Surgical History:   has a past surgical history that includes Testicle removal (Left, 1997); Tonsillectomy; Colonoscopy (10/2013); Wisdom tooth extraction; Cardiac catheterization (N/A, 11/07/2015); Cataract extraction, bilateral; Laparoscopic cholecystectomy single site with intraoperative cholangiogram (N/A, 08/23/2019); transthoracic echocardiogram (09/27/2015); Exercise Treadmill Stress Test (11/04/2015); and Cholecystectomy Ger[facility] Long). Social History:   reports that he quit smoking about 51 years ago. His smoking use included cigarettes. He started smoking about 54 years ago. He has never used smokeless tobacco. He reports current alcohol use of about 3.0 standard drinks of alcohol per week. He reports that he does not use drugs. Family History:  family history includes Aneurysm in his father; Atrial fibrillation in his mother; CAD (age of onset: 12) in  his mother; COPD in his father; Coronary artery disease in his father; Deep vein thrombosis in his daughter; Deep vein thrombosis (age of onset: 51) in his father; Dementia in his mother; Heart attack in his mother; Heart disease in his mother; Hypertension in his father and mother; Prostate cancer in his father. Allergies:  is allergic to lactose intolerance (gi), penicillins, and oxycodone .   Medication Reconciliation: Current Outpatient Medications on File Prior to Visit  Medication Sig   Cholecalciferol (VITAMIN D3 PO) Take by mouth daily.   Coenzyme Q10 (CO Q 10 PO) Take 1 tablet by mouth in the morning.   Cyanocobalamin (VITAMIN B-12 PO) Take 1 tablet by mouth daily.   levothyroxine (SYNTHROID) 75 MCG tablet Take 75 mcg by mouth daily.   Misc Natural Products (FOCUSED MIND PO) FOCUS FACTOR   Multiple Vitamin (MULTIVITAMIN) tablet Take 1 tablet by mouth at bedtime.    omeprazole (PRILOSEC) 40 MG capsule Take 40 mg by mouth 2 (two) times daily.   OVER THE COUNTER MEDICATION Take 1 tablet by mouth in the morning and at bedtime. Focus Factor   Propylene Glycol (SYSTANE BALANCE) 0.6 % SOLN Place 1 drop into both eyes daily as needed (dry eyes).   rosuvastatin  (CRESTOR ) 40 MG tablet Take 1 tablet (40 mg total) by mouth daily.   sodium fluoride (FLUORISHIELD) 1.1 % GEL dental gel Apply 1 Application to the mouth or throat daily. Apply thin ribbon within trays and insert over teeth.  Leave in  place for 5 minutes.  Remove trays and spit out excess.  Do not eat, drink or rinse for 30 minutes.   solifenacin (VESICARE) 10 MG tablet Take 10 mg by mouth daily.    tadalafil (CIALIS) 20 MG tablet Take 20 mg by mouth daily as needed for erectile dysfunction.    tamsulosin  (FLOMAX ) 0.4 MG CAPS capsule Take 0.4 mg by mouth daily after breakfast.    atorvastatin  (LIPITOR) 40 MG tablet Take 1 tablet (40 mg total) by mouth daily. (Patient not taking: Reported on 11/21/2023)   Multiple Vitamins-Minerals (ZINC PO)  Take by mouth daily. (Patient not taking: Reported on 11/21/2023)   No current facility-administered medications on file prior to visit.  There are no discontinued medications.   Physical Exam:    11/21/2023    8:27 AM 09/08/2023   11:28 AM 08/22/2023   10:36 AM  Vitals with BMI  Height 5' 5 5' 5 5' 5  Weight 142 lbs 3 oz 142 lbs 142 lbs 10 oz  BMI 23.66 23.63 23.73  Systolic 120  100  Diastolic 62  60  Pulse 69  72  Vital signs reviewed.  Nursing notes reviewed. Weight trend reviewed. Physical Activity: Sufficiently Active (10/28/2022)   Received from [facility]   Exercise Vital Sign    On average, how many days per week do you engage in moderate to strenuous exercise (like a brisk walk)?: 5 days    On average, how many minutes do you engage in exercise at this level?: 50 min   General Appearance:  No acute distress appreciable.   Well-groomed, healthy-appearing male.  Well proportioned with no abnormal fat distribution.  Good muscle tone. Pulmonary:  Normal work of breathing at rest, no respiratory distress apparent. SpO2: 98 %  Musculoskeletal: All extremities are intact.  Neurological:  Awake, alert, oriented, and engaged.  No obvious focal neurological deficits or cognitive impairments.  Sensorium seems unclouded.   Speech is clear and coherent with logical content. Psychiatric:  Appropriate mood, pleasant and cooperative demeanor, thoughtful and engaged during the exam  Verbalized to patient: Results RADIOLOGY PET CT: Resolution of the mass in the neck at the left tongue base. No residual disease. (06/15/2023) Thyroid  ultrasound: No nodules. Low suspicion cystic nodule in the mid lobe of the right thyroid . (10/11/2023)  DIAGNOSTIC EGD with biopsies: Hiatal hernia and Barrett's esophagus. No active disease. (10/12/2023)     04/19/2023   10:41 AM  PHQ 2/9 Scores  PHQ - 2 Score 0     Office Visit on 11/21/2023  Component Date Value Ref Range Status   WBC 11/21/2023  3.7 (L)  4.0 - 10.5 K/uL Final   RBC 11/21/2023 3.40 (L)  4.22 - 5.81 Mil/uL Final   Hemoglobin 11/21/2023 10.9 (L)  13.0 - 17.0 g/dL Final   HCT 90/84/7974 32.0 (L)  39.0 - 52.0 % Final   MCV 11/21/2023 94.1  78.0 - 100.0 fl Final   MCHC 11/21/2023 34.2  30.0 - 36.0 g/dL Final   RDW 90/84/7974 12.3  11.5 - 15.5 % Final   Platelets 11/21/2023 148.0 (L)  150.0 - 400.0 K/uL Final   Neutrophils Relative % 11/21/2023 62.7  43.0 - 77.0 % Final   Lymphocytes Relative 11/21/2023 15.2  12.0 - 46.0 % Final   Monocytes Relative 11/21/2023 16.0 (H)  3.0 - 12.0 % Final   Eosinophils Relative 11/21/2023 5.3 (H)  0.0 - 5.0 % Final   Basophils Relative 11/21/2023 0.8  0.0 - 3.0 %  Final   Neutro Abs 11/21/2023 2.3  1.4 - 7.7 K/uL Final   Lymphs Abs 11/21/2023 0.6 (L)  0.7 - 4.0 K/uL Final   Monocytes Absolute 11/21/2023 0.6  0.1 - 1.0 K/uL Final   Eosinophils Absolute 11/21/2023 0.2  0.0 - 0.7 K/uL Final   Basophils Absolute 11/21/2023 0.0  0.0 - 0.1 K/uL Final   Vitamin B-12 11/21/2023 493  211 - 911 pg/mL Final   Folate 11/21/2023 >22.9  >5.9 ng/mL Final   Magnesium 11/21/2023 1.8  1.5 - 2.5 mg/dL Final   Cholesterol 90/84/7974 154  0 - 200 mg/dL Final   Triglycerides 90/84/7974 46.0  0.0 - 149.0 mg/dL Final   HDL 90/84/7974 66.90  >39.00 mg/dL Final   VLDL 90/84/7974 9.2  0.0 - 59.9 mg/dL Final   LDL Cholesterol 11/21/2023 78  0 - 99 mg/dL Final   Total CHOL/HDL Ratio 11/21/2023 2   Final   NonHDL 11/21/2023 86.91   Final   Uric Acid, Serum 11/21/2023 5.0  4.0 - 7.8 mg/dL Final   Microalb, Ur 90/84/7974 <0.7  mg/dL Final   Creatinine,U 90/84/7974 83.3  mg/dL Final   Microalb Creat Ratio 11/21/2023 Unable to calculate  0.0 - 30.0 mg/g Final  Office Visit on 08/22/2023  Component Date Value Ref Range Status   WBC 08/22/2023 3.0 (L)  4.0 - 10.5 K/uL Final   RBC 08/22/2023 3.17 (L)  4.22 - 5.81 Mil/uL Final   Hemoglobin 08/22/2023 10.3 (L)  13.0 - 17.0 g/dL Final   HCT 93/83/7974 30.3 (L)  39.0 -  52.0 % Final   MCV 08/22/2023 95.6  78.0 - 100.0 fl Final   MCHC 08/22/2023 34.0  30.0 - 36.0 g/dL Final   RDW 93/83/7974 13.6  11.5 - 15.5 % Final   Platelets 08/22/2023 172.0  150.0 - 400.0 K/uL Final   Neutrophils Relative % 08/22/2023 64.1  43.0 - 77.0 % Final   Lymphocytes Relative 08/22/2023 14.7  12.0 - 46.0 % Final   Monocytes Relative 08/22/2023 16.2 (H)  3.0 - 12.0 % Final   Eosinophils Relative 08/22/2023 3.9  0.0 - 5.0 % Final   Basophils Relative 08/22/2023 1.1  0.0 - 3.0 % Final   Neutro Abs 08/22/2023 1.9  1.4 - 7.7 K/uL Final   Lymphs Abs 08/22/2023 0.4 (L)  0.7 - 4.0 K/uL Final   Monocytes Absolute 08/22/2023 0.5  0.1 - 1.0 K/uL Final   Eosinophils Absolute 08/22/2023 0.1  0.0 - 0.7 K/uL Final   Basophils Absolute 08/22/2023 0.0  0.0 - 0.1 K/uL Final   Sodium 08/22/2023 139  135 - 145 mEq/L Final   Potassium 08/22/2023 4.5  3.5 - 5.1 mEq/L Final   Chloride 08/22/2023 104  96 - 112 mEq/L Final   CO2 08/22/2023 30  19 - 32 mEq/L Final   Glucose, Bld 08/22/2023 89  70 - 99 mg/dL Final   BUN 93/83/7974 28 (H)  6 - 23 mg/dL Final   Creatinine, Ser 08/22/2023 1.28  0.40 - 1.50 mg/dL Final   Total Bilirubin 08/22/2023 0.4  0.2 - 1.2 mg/dL Final   Alkaline Phosphatase 08/22/2023 43  39 - 117 U/L Final   AST 08/22/2023 21  0 - 37 U/L Final   ALT 08/22/2023 15  0 - 53 U/L Final   Total Protein 08/22/2023 6.3  6.0 - 8.3 g/dL Final   Albumin  08/22/2023 3.6  3.5 - 5.2 g/dL Final   GFR 93/83/7974 54.63 (L)  >60.00 mL/min Final  Calcium  08/22/2023 8.5  8.4 - 10.5 mg/dL Final   Magnesium 93/83/7974 1.9  1.5 - 2.5 mg/dL Final   QUESTION/PROBLEM 08/22/2023    Final   UNCLEAR ORDER: 08/22/2023 [id number]   Final   SPECIMEN(S) SUBMITTED 08/22/2023 SERUM   Final   T3 Uptake 08/22/2023 35  22 - 35 % Final   T4, Total 08/22/2023 4.6 (L)  4.9 - 10.5 mcg/dL Final   Free Thyroxine Index 08/22/2023 1.6  1.4 - 3.8 Final   TEST NAME: 08/22/2023 THYROID  PANEL   Final   TEST CODE:  08/22/2023 [id number]   Final   CLIENT CONTACT: 08/22/2023 EBONY WILLIS   Final   REPORT ALWAYS MESSAGE SIGNATURE 08/22/2023    Final  Office Visit on 04/19/2023  Component Date Value Ref Range Status   Cholesterol 04/19/2023 169  0 - 200 mg/dL Final   Triglycerides 97/88/7974 69.0  0.0 - 149.0 mg/dL Final   HDL 97/88/7974 71.60  >39.00 mg/dL Final   VLDL 97/88/7974 13.8  0.0 - 40.0 mg/dL Final   LDL Cholesterol 04/19/2023 84  0 - 99 mg/dL Final   Total CHOL/HDL Ratio 04/19/2023 2   Final   NonHDL 04/19/2023 97.69   Final   Sodium 04/19/2023 138  135 - 145 mEq/L Final   Potassium 04/19/2023 4.1  3.5 - 5.1 mEq/L Final   Chloride 04/19/2023 100  96 - 112 mEq/L Final   CO2 04/19/2023 28  19 - 32 mEq/L Final   Glucose, Bld 04/19/2023 108 (H)  70 - 99 mg/dL Final   BUN 97/88/7974 24 (H)  6 - 23 mg/dL Final   Creatinine, Ser 04/19/2023 1.06  0.40 - 1.50 mg/dL Final   Total Bilirubin 04/19/2023 0.5  0.2 - 1.2 mg/dL Final   Alkaline Phosphatase 04/19/2023 55  39 - 117 U/L Final   AST 04/19/2023 22  0 - 37 U/L Final   ALT 04/19/2023 15  0 - 53 U/L Final   Total Protein 04/19/2023 6.4  6.0 - 8.3 g/dL Final   Albumin  04/19/2023 3.7  3.5 - 5.2 g/dL Final   GFR 97/88/7974 68.67  >60.00 mL/min Final   Calcium  04/19/2023 8.7  8.4 - 10.5 mg/dL Final   WBC 97/88/7974 4.5  4.0 - 10.5 K/uL Final   RBC 04/19/2023 2.92 (L)  4.22 - 5.81 Mil/uL Final   Hemoglobin 04/19/2023 10.1 (L)  13.0 - 17.0 g/dL Final   HCT 97/88/7974 29.5 (L)  39.0 - 52.0 % Final   MCV 04/19/2023 101.2 (H)  78.0 - 100.0 fl Final   MCHC 04/19/2023 34.1  30.0 - 36.0 g/dL Final   RDW 97/88/7974 13.8  11.5 - 15.5 % Final   Platelets 04/19/2023 168.0  150.0 - 400.0 K/uL Final   Neutrophils Relative % 04/19/2023 74.3  43.0 - 77.0 % Final   Lymphocytes Relative 04/19/2023 9.8 (L)  12.0 - 46.0 % Final   Monocytes Relative 04/19/2023 11.1  3.0 - 12.0 % Final   Eosinophils Relative 04/19/2023 3.7  0.0 - 5.0 % Final   Basophils Relative  04/19/2023 1.1  0.0 - 3.0 % Final   Neutro Abs 04/19/2023 3.3  1.4 - 7.7 K/uL Final   Lymphs Abs 04/19/2023 0.4 (L)  0.7 - 4.0 K/uL Final   Monocytes Absolute 04/19/2023 0.5  0.1 - 1.0 K/uL Final   Eosinophils Absolute 04/19/2023 0.2  0.0 - 0.7 K/uL Final   Basophils Absolute 04/19/2023 0.0  0.0 - 0.1 K/uL Final   TSH 04/19/2023 12.300 (  H)  0.450 - 4.500 uIU/mL Final   PSA 04/19/2023 1.79  0.10 - 4.00 ng/mL Final   Magnesium 04/19/2023 1.8  1.5 - 2.5 mg/dL Final   U5,Qmzz (Direct) 04/19/2023 0.86  0.82 - 1.77 ng/dL Final  Hospital Outpatient Visit on 10/27/2022  Component Date Value Ref Range Status   Glucose-Capillary 10/27/2022 75  70 - 99 mg/dL Final  Appointment on 94/82/7975  Component Date Value Ref Range Status   Recommendations-PTGENE: 07/23/2022 Comment   Final   Reviewed by: 07/23/2022 Larose Agent, PhD   Final   Recommendations-F5LEID: 07/23/2022 Comment   Final   Reviewed By: 07/23/2022 Larose Agent, PhD   Final   Beta-2  Glyco I IgG 07/23/2022 <9  0 - 20 GPI IgG units Final   Beta-2 -Glycoprotein I IgM 07/23/2022 21  0 - 32 GPI IgM units Final   Beta-2 -Glycoprotein I IgA 07/23/2022 <9  0 - 25 GPI IgA units Final   Anticardiolipin IgG 07/23/2022 <9  0 - 14 GPL U/mL Final   Anticardiolipin IgM 07/23/2022 9  0 - 12 MPL U/mL Final   Anticardiolipin IgA 07/23/2022 <9  0 - 11 APL U/mL Final   Sodium 07/23/2022 138  135 - 145 mmol/L Final   Potassium 07/23/2022 4.6  3.5 - 5.1 mmol/L Final   Chloride 07/23/2022 102  98 - 111 mmol/L Final   CO2 07/23/2022 31  22 - 32 mmol/L Final   Glucose, Bld 07/23/2022 109 (H)  70 - 99 mg/dL Final   BUN 94/82/7975 17  8 - 23 mg/dL Final   Creatinine 94/82/7975 0.98  0.61 - 1.24 mg/dL Final   Calcium  07/23/2022 9.1  8.9 - 10.3 mg/dL Final   Total Protein 94/82/7975 6.9  6.5 - 8.1 g/dL Final   Albumin  07/23/2022 4.0  3.5 - 5.0 g/dL Final   AST 94/82/7975 22  15 - 41 U/L Final   ALT 07/23/2022 18  0 - 44 U/L Final   Alkaline Phosphatase  07/23/2022 75  38 - 126 U/L Final   Total Bilirubin 07/23/2022 0.5  0.3 - 1.2 mg/dL Final   GFR, Estimated 07/23/2022 >60  >60 mL/min Final   Anion gap 07/23/2022 5  5 - 15 Final   WBC Count 07/23/2022 6.0  4.0 - 10.5 K/uL Final   RBC 07/23/2022 4.61  4.22 - 5.81 MIL/uL Final   Hemoglobin 07/23/2022 15.0  13.0 - 17.0 g/dL Final   HCT 94/82/7975 42.6  39.0 - 52.0 % Final   MCV 07/23/2022 92.4  80.0 - 100.0 fL Final   MCH 07/23/2022 32.5  26.0 - 34.0 pg Final   MCHC 07/23/2022 35.2  30.0 - 36.0 g/dL Final   RDW 94/82/7975 12.1  11.5 - 15.5 % Final   Platelet Count 07/23/2022 180  150 - 400 K/uL Final   nRBC 07/23/2022 0.0  0.0 - 0.2 % Final   Neutrophils Relative % 07/23/2022 69  % Final   Neutro Abs 07/23/2022 4.2  1.7 - 7.7 K/uL Final   Lymphocytes Relative 07/23/2022 18  % Final   Lymphs Abs 07/23/2022 1.1  0.7 - 4.0 K/uL Final   Monocytes Relative 07/23/2022 9  % Final   Monocytes Absolute 07/23/2022 0.5  0.1 - 1.0 K/uL Final   Eosinophils Relative 07/23/2022 3  % Final   Eosinophils Absolute 07/23/2022 0.2  0.0 - 0.5 K/uL Final   Basophils Relative 07/23/2022 1  % Final   Basophils Absolute 07/23/2022 0.0  0.0 - 0.1 K/uL Final  Immature Granulocytes 07/23/2022 0  % Final   Abs Immature Granulocytes 07/23/2022 0.02  0.00 - 0.07 K/uL Final  No image results found. DG Foot 2 Views Left Result Date: 09/08/2023 Please see detailed radiograph report in office note.        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 Current levothyroxine management may be suboptimal due to concurrent intake with omeprazole, affecting absorption. Instruct to take levothyroxine on an empty stomach with water, separate from other medications. Order thyroid  function tests to assess  management and adjust dosage if necessary. Need for COVID-19 vaccine  Hypomagnesemia Will order lab testing to guide management.  Hyperlipidemia with target LDL less than 70 Hyperlipidemia   Hyperlipidemia is well-controlled, but regular monitoring is required to prevent cardiovascular complications. Order a lipid panel to monitor cholesterol levels. Macrocytosis Will order lab testing to guide management.  Elevated serum creatinine Will order lab testing to guide management.  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 Recent imaging shows resolution of the mass with no residual disease, consistent with the high cure rate for head and neck cancers. Continue regular follow-up with the oncology team. Discuss with the [facility] team regarding the low suspicion cystic nodule in the mid lobe of the right thyroid  and consider biopsy if recommended. Coronary artery disease, non-occlusive Coronary artery disease with a history of elevated cholesterol. Currently not on aspirin  post-Eliquis . Start low-dose aspirin  daily unless contraindicated by the [facility] team to reduce heart attack and blood clot risk. Barrett's esophagus with high grade dysplasia Barrett's esophagus with hiatal hernia confirmed on recent EGD with biopsies shows no active disease. Benign prostatic hyperplasia with lower urinary tract symptoms, symptom details unspecified Benign prostatic hyperplasia with nocturia, requiring getting up three times per night, is managed with Flomax  and Vesicare. Surgical intervention was discussed but deferred due to associated risks. Continue current medications and discuss surgical options with a urologist if symptoms worsen.   ORDER ASSOCIATIONS  #   DIAGNOSIS / CONDITION  ICD-10 ENCOUNTER ORDER     ICD-10-CM   1. Acquired hypothyroidism  E03.9 TSH + free T4    2. Need for COVID-19 vaccine  Z23 COVID-19 mRNA vaccine (SPIKEVAX) syringe    3. Hypomagnesemia  E83.42 Magnesium    4. Hyperlipidemia with target LDL less than 70  E78.5 aspirin  EC 81 MG tablet    Lipid panel    5. Macrocytosis  D75.89 CBC with Differential/Platelet    B12 and Folate Panel    6. Elevated serum creatinine  R79.89 Uric acid    Microalbumin / creatinine urine ratio    7. Postural kyphosis of cervicothoracic region  M40.03     8. Oropharyngeal dysphagia  R13.12     9. Coronary artery disease, non-occlusive  I25.10     10. Barrett's esophagus with high grade dysplasia  K22.711     11. Benign prostatic hyperplasia with lower urinary tract symptoms, symptom details unspecified  N40.1            Orders Placed in Encounter:   Lab Orders         TSH + free T4  CBC with Differential/Platelet         B12 and Folate Panel         Magnesium         Lipid panel         Uric acid         Microalbumin / creatinine urine ratio      Meds ordered this encounter  Medications   COVID-19 mRNA vaccine (SPIKEVAX) syringe    Sig: Inject 0.5 mLs into the muscle once for 1 dose.    Dispense:  0.5 mL    Refill:  0    Approved at provider discretion. Product selection permitted.   aspirin  EC 81 MG tablet    Sig: Take 1 tablet (81 mg total) by mouth daily. Swallow whole.    Orders Placed This Encounter  Procedures   Flu vaccine HIGH DOSE PF(Fluzone Trivalent)   TSH + free T4   CBC with Differential/Platelet   B12 and Folate Panel   Magnesium   Lipid panel   Uric acid   Microalbumin / creatinine urine ratio    [place]     This document was synthesized by artificial intelligence (Abridge) using HIPAA-compliant recording of the clinical interaction;   We discussed the use of AI scribe software for clinical note transcription with the patient, who gave verbal consent to  proceed. additional Info: This encounter employed state-of-the-art, real-time, collaborative documentation. The patient actively reviewed and assisted in updating their electronic medical record on a shared screen, ensuring transparency and facilitating joint problem-solving for the problem list, overview, and plan. This approach promotes accurate, informed care. The treatment plan was discussed and reviewed in detail, including medication safety, potential side effects, and all patient questions. We confirmed understanding and comfort with the plan. Follow-up instructions were established, including contacting the office for any concerns, returning if symptoms worsen, persist, or new symptoms develop, and precautions for potential emergency department visits.

## 2023-11-21 NOTE — Assessment & Plan Note (Signed)
 Will order lab testing to guide management.

## 2023-11-21 NOTE — Assessment & Plan Note (Signed)
 Coronary artery disease with a history of elevated cholesterol. Currently not on aspirin  post-Eliquis . Start low-dose aspirin  daily unless contraindicated by the [facility] team to reduce heart attack and blood clot risk.

## 2023-11-21 NOTE — Assessment & Plan Note (Signed)
 Barrett's esophagus with hiatal hernia confirmed on recent EGD with biopsies shows no active disease.

## 2023-11-21 NOTE — Assessment & Plan Note (Signed)
 Hyperlipidemia   Hyperlipidemia is well-controlled, but regular monitoring is required to prevent cardiovascular complications. Order a lipid panel to monitor cholesterol levels.

## 2023-11-21 NOTE — Assessment & Plan Note (Signed)
 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 Current levothyroxine management may be suboptimal due to concurrent intake with omeprazole, affecting absorption. Instruct to take levothyroxine on an empty stomach with water, separate from other medications. Order thyroid  function tests to assess management and adjust dosage if necessary.

## 2023-11-21 NOTE — Assessment & Plan Note (Signed)
 Benign prostatic hyperplasia with nocturia, requiring getting up three times per night, is managed with Flomax  and Vesicare. Surgical intervention was discussed but deferred due to associated risks. Continue current medications and discuss surgical options with a urologist if symptoms worsen.

## 2023-11-22 ENCOUNTER — Ambulatory Visit: Admitting: Internal Medicine

## 2023-11-22 LAB — TSH+FREE T4: TSH W/REFLEX TO FT4: 4.53 m[IU]/L — ABNORMAL HIGH (ref 0.40–4.50)

## 2023-11-22 LAB — T4, FREE: Free T4: 1.2 ng/dL (ref 0.8–1.8)

## 2023-11-25 ENCOUNTER — Ambulatory Visit: Payer: Self-pay | Admitting: Internal Medicine

## 2023-12-08 ENCOUNTER — Ambulatory Visit: Admitting: Podiatry

## 2023-12-08 ENCOUNTER — Telehealth: Payer: Self-pay | Admitting: Podiatry

## 2023-12-08 ENCOUNTER — Encounter: Payer: Self-pay | Admitting: Podiatry

## 2023-12-08 VITALS — Ht 65.0 in | Wt 142.0 lb

## 2023-12-08 DIAGNOSIS — M21621 Bunionette of right foot: Secondary | ICD-10-CM

## 2023-12-08 DIAGNOSIS — M779 Enthesopathy, unspecified: Secondary | ICD-10-CM | POA: Diagnosis not present

## 2023-12-08 DIAGNOSIS — L84 Corns and callosities: Secondary | ICD-10-CM

## 2023-12-08 NOTE — Progress Notes (Signed)
 Subjective:   Patient ID: Dwayne Harper, male   DOB: 78 y.o.   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  I did do courtesy debridement of lesions to take pressure off discussed tailor's bunion and implant capsule along with a plantarflexed metatarsal.  Patient will be seen back to recheck

## 2023-12-08 NOTE — Telephone Encounter (Signed)
 Pt called in reference to husband orthotics they have contacted [employer] for [facility]otics [employer] has to approve them ahead before you ordered . Please call pt. [phone] Wife's orthotics was not pre approve that why she didn't received them wife stated

## 2023-12-09 NOTE — Telephone Encounter (Signed)
 Trish, not sure what she is talking about. You can offer them to her at discount. I'd charge in the vicinity of 150. Not sure if she signed abn

## 2024-01-12 ENCOUNTER — Telehealth: Payer: Self-pay

## 2024-01-12 NOTE — Telephone Encounter (Signed)
 AUTH DONE ON [employer]- NO AUTH REQ

## 2024-01-12 NOTE — Telephone Encounter (Signed)
 Patient called and LVM needing PA to be faxed to [facility] faxed

## 2024-01-19 ENCOUNTER — Other Ambulatory Visit: Payer: Self-pay | Admitting: Internal Medicine

## 2024-01-19 NOTE — Telephone Encounter (Unsigned)
 Copied from CRM #[id number]. Topic: Clinical - Medication Refill >> Jan 19, 2024  2:42 PM Sasha M wrote: Medication: omeprazole (PRILOSEC) 40 MG capsule  Has the patient contacted their pharmacy? No (Agent: If no, request that the patient contact the pharmacy for the refill. If patient does not wish to contact the pharmacy document the reason why and proceed with request.) (Agent: If yes, when and what did the pharmacy advise?)  This is the patient's preferred pharmacy:  [facility] [address], [place] - [address] [place] [address] Phone: [phone] Fax: [phone]  Is this the correct pharmacy for this prescription? Yes If no, delete pharmacy and type the correct one.   Has the prescription been filled recently? No  Is the patient out of the medication? No  Has the patient been seen for an appointment in the last year OR does the patient have an upcoming appointment? Yes  Can we respond through MyChart? Yes  Agent: Please be advised that Rx refills may take up to 3 business days. We ask that you follow-up with your pharmacy.

## 2024-01-20 MED ORDER — OMEPRAZOLE 40 MG PO CPDR: 40.0000 mg | DELAYED_RELEASE_CAPSULE | Freq: Two times a day (BID) | ORAL | 1 refills | Status: DC

## 2024-02-21 ENCOUNTER — Other Ambulatory Visit

## 2024-03-11 ENCOUNTER — Other Ambulatory Visit: Payer: Self-pay | Admitting: Internal Medicine

## 2024-04-19 ENCOUNTER — Encounter: Payer: Medicare PPO | Admitting: Internal Medicine
